# Patient Record
Sex: Female | Born: 1935 | Race: White | Hispanic: No | Smoking: Former smoker
Health system: Southern US, Community
[De-identification: ages and names within clinical notes are randomized; demographics above are authoritative.]

## PROBLEM LIST (undated history)

## (undated) DIAGNOSIS — R42 Dizziness and giddiness: Secondary | ICD-10-CM

## (undated) DIAGNOSIS — E7800 Pure hypercholesterolemia, unspecified: Secondary | ICD-10-CM

## (undated) DIAGNOSIS — E079 Disorder of thyroid, unspecified: Secondary | ICD-10-CM

## (undated) DIAGNOSIS — R3915 Urgency of urination: Secondary | ICD-10-CM

## (undated) HISTORY — PX: APPENDECTOMY: SHX54

## (undated) HISTORY — PX: HIP SURGERY: SHX245

## (undated) HISTORY — PX: TOE SURGERY: SHX1073

## (undated) HISTORY — DX: Disorder of thyroid, unspecified: E07.9

---

## 1999-02-08 ENCOUNTER — Encounter: Admission: RE | Admit: 1999-02-08 | Discharge: 1999-02-08 | Payer: Self-pay | Admitting: Internal Medicine

## 1999-02-08 ENCOUNTER — Encounter: Payer: Self-pay | Admitting: Internal Medicine

## 1999-09-15 ENCOUNTER — Other Ambulatory Visit: Admission: RE | Admit: 1999-09-15 | Discharge: 1999-09-15 | Payer: Self-pay | Admitting: Internal Medicine

## 2000-02-15 ENCOUNTER — Encounter: Payer: Self-pay | Admitting: Internal Medicine

## 2000-02-15 ENCOUNTER — Encounter: Admission: RE | Admit: 2000-02-15 | Discharge: 2000-02-15 | Payer: Self-pay | Admitting: Internal Medicine

## 2001-02-19 ENCOUNTER — Encounter: Payer: Self-pay | Admitting: Internal Medicine

## 2001-02-19 ENCOUNTER — Encounter: Admission: RE | Admit: 2001-02-19 | Discharge: 2001-02-19 | Payer: Self-pay | Admitting: Internal Medicine

## 2001-09-17 ENCOUNTER — Encounter: Payer: Self-pay | Admitting: Orthopedic Surgery

## 2001-09-24 ENCOUNTER — Encounter: Payer: Self-pay | Admitting: Orthopedic Surgery

## 2001-09-24 ENCOUNTER — Inpatient Hospital Stay (HOSPITAL_COMMUNITY): Admission: RE | Admit: 2001-09-24 | Discharge: 2001-09-28 | Payer: Self-pay | Admitting: Orthopedic Surgery

## 2002-02-06 ENCOUNTER — Encounter: Payer: Self-pay | Admitting: Internal Medicine

## 2002-02-06 ENCOUNTER — Encounter: Admission: RE | Admit: 2002-02-06 | Discharge: 2002-02-06 | Payer: Self-pay | Admitting: Internal Medicine

## 2003-02-10 ENCOUNTER — Encounter: Admission: RE | Admit: 2003-02-10 | Discharge: 2003-02-10 | Payer: Self-pay | Admitting: Internal Medicine

## 2004-03-04 ENCOUNTER — Encounter: Admission: RE | Admit: 2004-03-04 | Discharge: 2004-03-04 | Payer: Self-pay | Admitting: Internal Medicine

## 2005-03-09 ENCOUNTER — Encounter: Admission: RE | Admit: 2005-03-09 | Discharge: 2005-03-09 | Payer: Self-pay | Admitting: Internal Medicine

## 2005-10-17 ENCOUNTER — Ambulatory Visit: Payer: Self-pay | Admitting: Ophthalmology

## 2006-03-17 ENCOUNTER — Encounter: Admission: RE | Admit: 2006-03-17 | Discharge: 2006-03-17 | Payer: Self-pay | Admitting: Internal Medicine

## 2006-07-05 ENCOUNTER — Emergency Department (HOSPITAL_COMMUNITY): Admission: EM | Admit: 2006-07-05 | Discharge: 2006-07-05 | Payer: Self-pay | Admitting: Emergency Medicine

## 2007-03-19 ENCOUNTER — Encounter: Admission: RE | Admit: 2007-03-19 | Discharge: 2007-04-09 | Payer: Self-pay | Admitting: Orthopedic Surgery

## 2007-03-26 ENCOUNTER — Encounter: Admission: RE | Admit: 2007-03-26 | Discharge: 2007-03-26 | Payer: Self-pay | Admitting: Internal Medicine

## 2007-03-29 ENCOUNTER — Encounter: Admission: RE | Admit: 2007-03-29 | Discharge: 2007-03-29 | Payer: Self-pay | Admitting: Internal Medicine

## 2008-12-21 ENCOUNTER — Emergency Department (HOSPITAL_BASED_OUTPATIENT_CLINIC_OR_DEPARTMENT_OTHER): Admission: EM | Admit: 2008-12-21 | Discharge: 2008-12-21 | Payer: Self-pay | Admitting: Emergency Medicine

## 2009-02-10 ENCOUNTER — Emergency Department (HOSPITAL_BASED_OUTPATIENT_CLINIC_OR_DEPARTMENT_OTHER): Admission: EM | Admit: 2009-02-10 | Discharge: 2009-02-10 | Payer: Self-pay | Admitting: Emergency Medicine

## 2010-03-21 ENCOUNTER — Encounter: Payer: Self-pay | Admitting: Internal Medicine

## 2010-05-21 ENCOUNTER — Ambulatory Visit: Payer: Self-pay | Admitting: Ophthalmology

## 2010-05-31 ENCOUNTER — Ambulatory Visit: Payer: Self-pay | Admitting: Ophthalmology

## 2010-06-01 LAB — CBC
Hemoglobin: 14.4 g/dL (ref 12.0–15.0)
MCHC: 35.1 g/dL (ref 30.0–36.0)
RDW: 12.3 % (ref 11.5–15.5)

## 2010-06-01 LAB — DIFFERENTIAL
Basophils Relative: 0 % (ref 0–1)
Eosinophils Absolute: 0.1 10*3/uL (ref 0.0–0.7)
Monocytes Relative: 4 % (ref 3–12)
Neutro Abs: 4.1 10*3/uL (ref 1.7–7.7)

## 2010-06-01 LAB — BASIC METABOLIC PANEL
CO2: 24 mEq/L (ref 19–32)
Calcium: 9.5 mg/dL (ref 8.4–10.5)
Creatinine, Ser: 0.6 mg/dL (ref 0.4–1.2)
Glucose, Bld: 99 mg/dL (ref 70–99)
Sodium: 140 mEq/L (ref 135–145)

## 2010-07-16 NOTE — H&P (Signed)
[facility]  Patient:    NOVICE, VRBA Visit Number: [id number] MRN: [id number]          Service Type: Attending:  Ollen Gross, M.D. Dictated by:   Alexzandrew L. Perkins, P.A.-C. Adm. Date:  09/24/01   CC:         Lenon Curt. Cassell Clement, M.D.   History and Physical  CHIEF COMPLAINT:  Left hip pain.  HISTORY OF PRESENT ILLNESS:  The patient is an 77 year old female who has been seen and evaluated by Dr. Ollen Gross for left hip pain.  This has been ongoing for quite some time now.  She comes in to be evaluated for consideration of left hip replacement.  She continues with significant left groin pain which has started to interfere with her daily activity and mobility and states that she even hurts after walking or even swinging a golf club. She is not having much pain at rest, but it is interfering with her activity. She would like to proceed with total hip replacement.  The risks and benefits have been discussed, and she has elected to proceed with surgery.  ALLERGIES:  No known drug allergies.  CURRENT MEDICATIONS: 1. Celebrex daily, stopped three days prior to surgery. 2. Premarin 0.625 mg daily. 3. Lipitor 10 mg daily. 4. Aspirin 81 mg daily, stopped prior to surgery. 5. Other herbal medications and supplements, which she has stopped prior to    her surgery including ginkgo, Ocuvite, lysine. 6. Calcium D. 7. Glucosamine.  PAST MEDICAL HISTORY: 1. Hypercholesterolemia. 2. Previous history of hepatitis.  PAST SURGICAL HISTORY: 1. Tonsillectomy in childhood years. 2. Appendectomy in the 1940s. 3. Hysterectomy in the 1960s. 4. Removal of benign breast tumor in the 1990s.  SOCIAL HISTORY:  Married.  She is a Futures trader.  No alcohol.  Only one glass of wine on a daily basis.  Has five children.  Her husband will be assisting her with her care postoperatively.  She has a one-story home with two steps entering on the porch, and three steps  entering into the house total.  REVIEW OF SYSTEMS:  GENERAL:  No fevers, chills, or night sweats.  NEUROLOGIC: No seizures, syncope, or paralysis.  RESPIRATORY:  No shortness of breath, productive cough, or hemoptysis.  CARDIOVASCULAR:  No chest pain, angina, or orthopnea.  GASTROINTESTINAL:  No nausea, vomiting, diarrhea, or constipation. History of hepatitis dating back to the 27s.  GENITOURINARY:  No dysuria, hematuria, or discharge.  She has had some intermittent itching. MUSCULOSKELETAL:  Pertinent to the left hip found in history of present illness.  PHYSICAL EXAMINATION:  VITAL SIGNS:  Pulse 64, respirations 12, blood pressure 130/70.  GENERAL:  The patient is an 77 year old female, well-nourished, well-developed.  Appears to be in no acute distress.  She is alert, oriented, and cooperative.  Very pleasant at the time of exam.  HEENT:  Normocephalic, atraumatic.  Pupils are round and reactive.  EOMs are intact.  Oropharynx clear.  NECK:  Supple.  CHEST:  Clear to auscultation in anterior and posterior chest walls.  No rhonchi, rales, or wheezing appreciated.  HEART:  Regular rate and rhythm.  No murmur.  S1, S2 noted.  ABDOMEN:  Soft, nontender.  Bowel sounds are present.  EXTREMITIES:  Limited to that of the left lower extremity:  She has hip flexion of 90 degrees, internal rotation of only 5 degrees, external rotation of only 10 degrees, abduction of 20 degrees.  Motor function is intact.  LABORATORY DATA:  X-rays show  arthritic left hip.  IMPRESSION: 1. Osteoarthritis left hip. 2. History of hepatitis. 3. Hypercholesterolemia.  PLAN:  The patient will be admitted to [facility] to undergo left total hip replacement arthroplasty.  The surgery will be performed by Dr. Ollen Gross.  The patient has been seen preoperatively by Dr. Murray Hodgkins and has been cleared for her up and coming surgery.  Dr. Chilton Si will be notified of the room number on  admission at [facility] and will be consulted as needed for any medical assistance with this patient throughout the hospital course. Dictated by:   Alexzandrew L. Perkins, P.A.-C. Attending:  Ollen Gross, M.D. DD:  09/20/01 TD:  09/24/01 Job: [id number]

## 2010-07-16 NOTE — Discharge Summary (Signed)
NAME:  Carmen Roy, Carmen Roy                          ACCOUNT NO.:  [id number]   MEDICAL RECORD NO.:  [id number]                   PATIENT TYPE:  INP   LOCATION:  0472                                 FACILITY:  [facility]   PHYSICIAN:  Carmen Rankin. Roy, M.D.              DATE OF BIRTH:  02-09-1919   DATE OF ADMISSION:  09/24/2001  DATE OF DISCHARGE:  09/28/2001                                 DISCHARGE SUMMARY   ADMITTING DIAGNOSES:  1. Osteoarthritis left hip.  2. History of hepatitis.  3. Hypercholesterolemia.   DISCHARGE DIAGNOSES:  1. Osteoarthritis left hip status post left total hip replacement     arthroplasty.  2. History of hepatitis.  3. Hypercholesterolemia.  4. Postoperative hyponatremia, improved.  5. Postoperative hypokalemia, improved.   PROCEDURE:  The patient was taken to the OR on September 24, 2001.  Underwent a  left total hip replacement arthroplasty.  Surgeon Dr. Homero Fellers Roy.  Assistant Avel Peace, P.A.-C.  Surgery under spinal anesthesia.  Hemovac  drain x1.   CONSULTS:  None.   BRIEF HISTORY:  The patient is an 77 year old female.  Has been seen and  evaluated by Dr. Lequita Halt for left hip pain that has been ongoing for quite  some time now.  She continues with significant left groin pain that is  starting to interfere with her daily activity and mobility.  Even hurts  after walking or swinging golf club.  She would like to proceed with total  hip replacement.  Risks and benefits discussed.  She elected to proceed with  surgery.   LABORATORY DATA:  CBC on admission showed hemoglobin 12.7, hematocrit 36.7,  white cell count 6.7, red cell count 3.98.  Postoperative H&H 9.7 and 28.4.  Last noted H&H 10.0 and 28.9.  Differential on the admission CBC all within  normal limits.  PT/PTT no admission 13.8 and 24 respectively with an INR of  1.0.  Serum pro times/INRs were followed.  Last noted PT and INR 14.7 and  1.1.  Chemistry panel on admission all within normal  limits.  Serial BMETs  were followed.  Had a drop in sodium postoperative from 135 to 130.  Fluids  were adjusted and her sodium came back and was back up to normal at 137.  Potassium dropped from 4.3 to 3.3.  Was placed on potassium supplements and  was back up to 4.3.  Glucose did elevate from 105 to 124 back down to 92.  Calcium dropped from 8.7 to 7.7.  Was back up to 8.4.  Urinalysis on  admission did show large leukocyte esterase with few epithelial cells, 11-20  white cells, and few bacteria.  Follow-up UA on September 24, 2001:  Moderate  hemoglobin, large leukocyte esterase, few epithelial cells, 21-50 white  cells, 3-6 red cells, few bacteria.  Blood group type A-.  EKG dated September 17, 2001:  Normal sinus rhythm, nonspecific  T-wave abnormalities.  No old  tracings to compare.  Confirmed by Dr. Olga Millers.  Chest x-ray dated  September 17, 2001:  Hyperinflation of lungs, question mild COPD.  Hip films  dated September 17, 2001:  Findings compatible with osteoarthritis left hip.  Postoperative hip films, pelvis, and left hip September 24, 2001 show  satisfactory postoperative repair of left total hip prosthesis.   HOSPITAL COURSE:  The patient was admitted to [facility], taken to  the operating room, and underwent the above said procedure without  complications.  The patient tolerated procedure well.  Was later transferred  to recovery room and then to the orthopedic floor for continued  postoperative care.  The patient received PCA analgesics for pain control  following surgery.  She was placed on Erixtra for DVT prophylaxis.  She did  receive 24 hours of IV antibiotics.  It was noted that she had a urinary  tract infection.  This was treated with IV antibiotics.  She was placed on  total hip protocol.  PT and OT were consulted postoperative to assist with  gait training, ambulation, and hip precautions.  The patient progressed very  well with her physical therapy and was up ambulating  approximately 70 feet  by postoperative day two and increased up to 90 feet.  Hemovac drain which  was placed at time of surgery was pulled on postoperative day one.  Dressing  change was initiated on postoperative day two.  Incision was healing well.  PCA and IV and Foleys were discontinued on postoperative day two.  She did  have some complaints of nausea by postoperative day three; however, this was  treated with antiemetics p.r.n.  By postoperative day four she was doing  much better.  Had been up ambulating well with physical therapy.  Tolerating  her medications.  It was decided she could be discharged home.   DISCHARGE PLAN:  The patient is discharged home on September 28, 2001.   DISCHARGE DIAGNOSES:  Please see above.   DISCHARGE MEDICATIONS:  1. Erixtra for six more days.  Once the Clista Bernhardt is completed she will take     enteric-coated aspirin daily.  2. Vicodin for pain.  3. Robaxin for spasm.   DIET:  Low cholesterol diet.   ACTIVITY:  Touchdown weightbearing, hip precautions, home health PT, home  health nursing through [facility].   FOLLOW UP:  Thursday, August 14.  Call the office for an appointment at [phone]  [phone].   CONDITION ON DISCHARGE:  Improved.     Alexzandrew L. Julien Girt, P.A.              Carmen Roy, M.D.    ALP/MEDQ  D:  10/10/2001  T:  10/12/2001  Job:  [id number]

## 2010-07-16 NOTE — Op Note (Signed)
Carmen Roy, Carmen Roy                         ACCOUNT NO.:  [id number]   MEDICAL RECORD NO.:  [id number]                   PATIENT TYPE:  INP   LOCATION:  0472                                 [facility]ACILITY:  [facility]   PHYSICIAN:  Gus Rankin. Aluisio, M.D.              DATE OF BIRTH:  Jan 03, 1919   DATE OF PROCEDURE:  09/24/2001  DATE OF DISCHARGE:                                 OPERATIVE REPORT   PREOPERATIVE DIAGNOSIS:  Osteoarthritis, left hip.   POSTOPERATIVE DIAGNOSIS:  Osteoarthritis, left hip.   PROCEDURE:  Left total hip arthroplasty.   SURGEON:  Gus Rankin. Aluisio, M.D.   ASSISTANT:  Alexzandrew L. Perkins, P.A.-C.   ANESTHESIA:  Spinal.   ESTIMATED BLOOD LOSS:  Minimal.   DRAINS:  Hemovac x 1.   COMPLICATIONS:  None.   DISPOSITION:  Stable to recovery.   BRIEF CLINICAL NOTE:  The patient is an 77 year old female with severe  osteoarthritis of the left hip with pain refractory to nonoperative  management.  She has progressively worsening symptoms and presents now for  total hip arthroplasty.   PROCEDURE IN DETAIL:  After the successful administration of spinal  anesthetic, the patient was placed in the right lateral decubitus position  with the left side up and held with the hip positioner.  The left lower  extremity was isolated from her perineum with plastic drapes and prepped and  draped in the usual sterile fashion.  A short posterolateral incision was  made with the 10 blade through subcutaneous tissue to the level of the  fascia lata which was incised in line with the skin incision.  Sciatic nerve  is palpated and protected and short external rotators isolated off the  femur.  The capsulotomy is performed.  I strip the capsule to stay posterior  and medial, and the capsule is removed.  Hip is then dislocated and the  center of the femoral head marked.  Trial prosthesis is placed such that the  center of the trial head corresponds to the center of the native  femoral  head.  The osteotomy line is then marked and an osteotomy made with an  oscillating saw.  Head is removed and the femur retracted anteriorly.  Acetabular exposure is obtained.   Acetabular reaming starts at 40, coursing increments of 49, and then a 15 mm  Pinnacle acetabular shell is placed.  It is an excellent fit and then is  transfixed with two dome screws.  Trial 28 mm neutral liner is placed.   Femur is then prepared, first with a canal finder and irrigation.  Axial  remaining is performed up to an 11.5 mm, proximal reaming up to a 16D, and  the sleeve is machined to a small.  The 16D small trial sleeve is placed,  and then the 16 x 11 stem with a 36 plus 6 neck is placed, matching her  native anteversion.  The  28 plus 3 head is placed as the resection level is  planed down to a smoother surface.  The hip is reduced, and there is  excellent soft tissue tension.  She had excellent stability, full extension,  full external rotation, 70 degrees flexion, 40 degrees adduction, 90 degrees  internal rotation; then 90 degrees flexion, 90 degrees internal rotation.  The wound is then copiously irrigated and all trials removed.  The apex hole  eliminator is placed into the acetabular shell, then the permanent 28 mm  neutral Marathon liner placed.  The 18D small sleeve is then placed into the  proximal femur, 18 x 13 stem, with a 36 plus 6 neck is placed.  A 28 plus 3  head is placed, hip reduced with the same stability parameters.  The wound  is copiously irrigated with antibiotic solution and short external rotators  reattached to the femur for drill holes.  The fascia lata is closed over a  Hemovac drain with interrupted #1 Vicryl, subcu closed with interrupted 2-0  Vicryl, and subcuticular running 4-0 Monocryl.  Incision is clean and dry  and Steri-Strips and a bulky sterile dressing applied.  Drain is hooked to  suction.  She is placed into a knee immobilizer, awakened, and  transported  to recovery in stable condition.                                                Gus Rankin Aluisio, M.D.    FVA/MEDQ  D:  09/24/2001  T:  09/27/2001  Job:  [id number]

## 2011-03-16 DIAGNOSIS — M5137 Other intervertebral disc degeneration, lumbosacral region: Secondary | ICD-10-CM | POA: Diagnosis not present

## 2011-03-16 DIAGNOSIS — M999 Biomechanical lesion, unspecified: Secondary | ICD-10-CM | POA: Diagnosis not present

## 2011-03-16 DIAGNOSIS — M545 Low back pain: Secondary | ICD-10-CM | POA: Diagnosis not present

## 2011-04-14 DIAGNOSIS — M999 Biomechanical lesion, unspecified: Secondary | ICD-10-CM | POA: Diagnosis not present

## 2011-04-14 DIAGNOSIS — M5137 Other intervertebral disc degeneration, lumbosacral region: Secondary | ICD-10-CM | POA: Diagnosis not present

## 2011-04-14 DIAGNOSIS — M545 Low back pain: Secondary | ICD-10-CM | POA: Diagnosis not present

## 2011-05-05 DIAGNOSIS — Z1231 Encounter for screening mammogram for malignant neoplasm of breast: Secondary | ICD-10-CM | POA: Diagnosis not present

## 2011-05-18 DIAGNOSIS — M545 Low back pain: Secondary | ICD-10-CM | POA: Diagnosis not present

## 2011-05-18 DIAGNOSIS — M5137 Other intervertebral disc degeneration, lumbosacral region: Secondary | ICD-10-CM | POA: Diagnosis not present

## 2011-05-18 DIAGNOSIS — M999 Biomechanical lesion, unspecified: Secondary | ICD-10-CM | POA: Diagnosis not present

## 2011-06-17 DIAGNOSIS — M999 Biomechanical lesion, unspecified: Secondary | ICD-10-CM | POA: Diagnosis not present

## 2011-06-17 DIAGNOSIS — M5137 Other intervertebral disc degeneration, lumbosacral region: Secondary | ICD-10-CM | POA: Diagnosis not present

## 2011-06-17 DIAGNOSIS — M545 Low back pain: Secondary | ICD-10-CM | POA: Diagnosis not present

## 2011-07-13 DIAGNOSIS — M999 Biomechanical lesion, unspecified: Secondary | ICD-10-CM | POA: Diagnosis not present

## 2011-07-13 DIAGNOSIS — M5137 Other intervertebral disc degeneration, lumbosacral region: Secondary | ICD-10-CM | POA: Diagnosis not present

## 2011-07-13 DIAGNOSIS — M545 Low back pain: Secondary | ICD-10-CM | POA: Diagnosis not present

## 2011-08-11 DIAGNOSIS — M999 Biomechanical lesion, unspecified: Secondary | ICD-10-CM | POA: Diagnosis not present

## 2011-08-11 DIAGNOSIS — M545 Low back pain: Secondary | ICD-10-CM | POA: Diagnosis not present

## 2011-08-11 DIAGNOSIS — M5137 Other intervertebral disc degeneration, lumbosacral region: Secondary | ICD-10-CM | POA: Diagnosis not present

## 2011-09-08 DIAGNOSIS — M545 Low back pain: Secondary | ICD-10-CM | POA: Diagnosis not present

## 2011-09-08 DIAGNOSIS — M5137 Other intervertebral disc degeneration, lumbosacral region: Secondary | ICD-10-CM | POA: Diagnosis not present

## 2011-09-08 DIAGNOSIS — M999 Biomechanical lesion, unspecified: Secondary | ICD-10-CM | POA: Diagnosis not present

## 2011-10-06 DIAGNOSIS — M5137 Other intervertebral disc degeneration, lumbosacral region: Secondary | ICD-10-CM | POA: Diagnosis not present

## 2011-10-06 DIAGNOSIS — M545 Low back pain: Secondary | ICD-10-CM | POA: Diagnosis not present

## 2011-10-06 DIAGNOSIS — M999 Biomechanical lesion, unspecified: Secondary | ICD-10-CM | POA: Diagnosis not present

## 2011-10-20 DIAGNOSIS — R82998 Other abnormal findings in urine: Secondary | ICD-10-CM | POA: Diagnosis not present

## 2011-10-20 DIAGNOSIS — M899 Disorder of bone, unspecified: Secondary | ICD-10-CM | POA: Diagnosis not present

## 2011-10-20 DIAGNOSIS — E785 Hyperlipidemia, unspecified: Secondary | ICD-10-CM | POA: Diagnosis not present

## 2011-10-20 DIAGNOSIS — M949 Disorder of cartilage, unspecified: Secondary | ICD-10-CM | POA: Diagnosis not present

## 2011-10-20 DIAGNOSIS — E039 Hypothyroidism, unspecified: Secondary | ICD-10-CM | POA: Diagnosis not present

## 2011-10-27 DIAGNOSIS — E039 Hypothyroidism, unspecified: Secondary | ICD-10-CM | POA: Diagnosis not present

## 2011-10-27 DIAGNOSIS — H5310 Unspecified subjective visual disturbances: Secondary | ICD-10-CM | POA: Diagnosis not present

## 2011-10-27 DIAGNOSIS — Z23 Encounter for immunization: Secondary | ICD-10-CM | POA: Diagnosis not present

## 2011-10-27 DIAGNOSIS — E785 Hyperlipidemia, unspecified: Secondary | ICD-10-CM | POA: Diagnosis not present

## 2011-10-27 DIAGNOSIS — M949 Disorder of cartilage, unspecified: Secondary | ICD-10-CM | POA: Diagnosis not present

## 2011-10-27 DIAGNOSIS — Z0000 Encounter for general adult medical examination without abnormal findings: Secondary | ICD-10-CM | POA: Diagnosis not present

## 2011-10-27 DIAGNOSIS — M899 Disorder of bone, unspecified: Secondary | ICD-10-CM | POA: Diagnosis not present

## 2011-10-27 DIAGNOSIS — H52209 Unspecified astigmatism, unspecified eye: Secondary | ICD-10-CM | POA: Diagnosis not present

## 2011-11-03 DIAGNOSIS — M545 Low back pain: Secondary | ICD-10-CM | POA: Diagnosis not present

## 2011-11-03 DIAGNOSIS — M5137 Other intervertebral disc degeneration, lumbosacral region: Secondary | ICD-10-CM | POA: Diagnosis not present

## 2011-11-03 DIAGNOSIS — M999 Biomechanical lesion, unspecified: Secondary | ICD-10-CM | POA: Diagnosis not present

## 2011-12-01 DIAGNOSIS — M999 Biomechanical lesion, unspecified: Secondary | ICD-10-CM | POA: Diagnosis not present

## 2011-12-01 DIAGNOSIS — M545 Low back pain: Secondary | ICD-10-CM | POA: Diagnosis not present

## 2011-12-01 DIAGNOSIS — M5137 Other intervertebral disc degeneration, lumbosacral region: Secondary | ICD-10-CM | POA: Diagnosis not present

## 2011-12-05 DIAGNOSIS — L570 Actinic keratosis: Secondary | ICD-10-CM | POA: Diagnosis not present

## 2011-12-05 DIAGNOSIS — C44611 Basal cell carcinoma of skin of unspecified upper limb, including shoulder: Secondary | ICD-10-CM | POA: Diagnosis not present

## 2011-12-05 DIAGNOSIS — D0460 Carcinoma in situ of skin of unspecified upper limb, including shoulder: Secondary | ICD-10-CM | POA: Diagnosis not present

## 2011-12-05 DIAGNOSIS — L821 Other seborrheic keratosis: Secondary | ICD-10-CM | POA: Diagnosis not present

## 2012-01-10 DIAGNOSIS — M999 Biomechanical lesion, unspecified: Secondary | ICD-10-CM | POA: Diagnosis not present

## 2012-01-10 DIAGNOSIS — M545 Low back pain: Secondary | ICD-10-CM | POA: Diagnosis not present

## 2012-01-10 DIAGNOSIS — M5137 Other intervertebral disc degeneration, lumbosacral region: Secondary | ICD-10-CM | POA: Diagnosis not present

## 2012-02-07 DIAGNOSIS — M5137 Other intervertebral disc degeneration, lumbosacral region: Secondary | ICD-10-CM | POA: Diagnosis not present

## 2012-02-07 DIAGNOSIS — M545 Low back pain: Secondary | ICD-10-CM | POA: Diagnosis not present

## 2012-02-07 DIAGNOSIS — M999 Biomechanical lesion, unspecified: Secondary | ICD-10-CM | POA: Diagnosis not present

## 2012-02-10 DIAGNOSIS — Z961 Presence of intraocular lens: Secondary | ICD-10-CM | POA: Diagnosis not present

## 2012-03-06 DIAGNOSIS — M5137 Other intervertebral disc degeneration, lumbosacral region: Secondary | ICD-10-CM | POA: Diagnosis not present

## 2012-03-06 DIAGNOSIS — M545 Low back pain: Secondary | ICD-10-CM | POA: Diagnosis not present

## 2012-03-06 DIAGNOSIS — M999 Biomechanical lesion, unspecified: Secondary | ICD-10-CM | POA: Diagnosis not present

## 2012-11-01 DIAGNOSIS — R82998 Other abnormal findings in urine: Secondary | ICD-10-CM | POA: Diagnosis not present

## 2012-11-01 DIAGNOSIS — M899 Disorder of bone, unspecified: Secondary | ICD-10-CM | POA: Diagnosis not present

## 2012-11-01 DIAGNOSIS — E785 Hyperlipidemia, unspecified: Secondary | ICD-10-CM | POA: Diagnosis not present

## 2012-11-01 DIAGNOSIS — E039 Hypothyroidism, unspecified: Secondary | ICD-10-CM | POA: Diagnosis not present

## 2012-11-08 DIAGNOSIS — E785 Hyperlipidemia, unspecified: Secondary | ICD-10-CM | POA: Diagnosis not present

## 2012-11-08 DIAGNOSIS — E039 Hypothyroidism, unspecified: Secondary | ICD-10-CM | POA: Diagnosis not present

## 2012-11-08 DIAGNOSIS — Z23 Encounter for immunization: Secondary | ICD-10-CM | POA: Diagnosis not present

## 2012-11-08 DIAGNOSIS — Z1331 Encounter for screening for depression: Secondary | ICD-10-CM | POA: Diagnosis not present

## 2012-11-08 DIAGNOSIS — Z0000 Encounter for general adult medical examination without abnormal findings: Secondary | ICD-10-CM | POA: Diagnosis not present

## 2012-11-08 DIAGNOSIS — H6980 Other specified disorders of Eustachian tube, unspecified ear: Secondary | ICD-10-CM | POA: Diagnosis not present

## 2012-11-08 DIAGNOSIS — G609 Hereditary and idiopathic neuropathy, unspecified: Secondary | ICD-10-CM | POA: Diagnosis not present

## 2012-11-08 DIAGNOSIS — M899 Disorder of bone, unspecified: Secondary | ICD-10-CM | POA: Diagnosis not present

## 2012-11-08 DIAGNOSIS — H8110 Benign paroxysmal vertigo, unspecified ear: Secondary | ICD-10-CM | POA: Diagnosis not present

## 2012-11-08 DIAGNOSIS — M1990 Unspecified osteoarthritis, unspecified site: Secondary | ICD-10-CM | POA: Diagnosis not present

## 2012-11-09 DIAGNOSIS — Z1212 Encounter for screening for malignant neoplasm of rectum: Secondary | ICD-10-CM | POA: Diagnosis not present

## 2012-12-06 DIAGNOSIS — D239 Other benign neoplasm of skin, unspecified: Secondary | ICD-10-CM | POA: Diagnosis not present

## 2012-12-06 DIAGNOSIS — Z85828 Personal history of other malignant neoplasm of skin: Secondary | ICD-10-CM | POA: Diagnosis not present

## 2012-12-06 DIAGNOSIS — C44721 Squamous cell carcinoma of skin of unspecified lower limb, including hip: Secondary | ICD-10-CM | POA: Diagnosis not present

## 2012-12-06 DIAGNOSIS — L821 Other seborrheic keratosis: Secondary | ICD-10-CM | POA: Diagnosis not present

## 2012-12-06 DIAGNOSIS — D485 Neoplasm of uncertain behavior of skin: Secondary | ICD-10-CM | POA: Diagnosis not present

## 2012-12-06 DIAGNOSIS — L570 Actinic keratosis: Secondary | ICD-10-CM | POA: Diagnosis not present

## 2013-02-14 DIAGNOSIS — H52209 Unspecified astigmatism, unspecified eye: Secondary | ICD-10-CM | POA: Diagnosis not present

## 2013-02-14 DIAGNOSIS — Z961 Presence of intraocular lens: Secondary | ICD-10-CM | POA: Diagnosis not present

## 2013-03-06 DIAGNOSIS — F321 Major depressive disorder, single episode, moderate: Secondary | ICD-10-CM | POA: Diagnosis not present

## 2013-03-06 DIAGNOSIS — IMO0002 Reserved for concepts with insufficient information to code with codable children: Secondary | ICD-10-CM | POA: Diagnosis not present

## 2013-03-06 DIAGNOSIS — E785 Hyperlipidemia, unspecified: Secondary | ICD-10-CM | POA: Diagnosis not present

## 2013-03-06 DIAGNOSIS — E039 Hypothyroidism, unspecified: Secondary | ICD-10-CM | POA: Diagnosis not present

## 2013-03-06 DIAGNOSIS — H8110 Benign paroxysmal vertigo, unspecified ear: Secondary | ICD-10-CM | POA: Diagnosis not present

## 2013-06-11 DIAGNOSIS — L723 Sebaceous cyst: Secondary | ICD-10-CM | POA: Diagnosis not present

## 2013-06-11 DIAGNOSIS — Z85828 Personal history of other malignant neoplasm of skin: Secondary | ICD-10-CM | POA: Diagnosis not present

## 2013-06-11 DIAGNOSIS — L820 Inflamed seborrheic keratosis: Secondary | ICD-10-CM | POA: Diagnosis not present

## 2013-07-08 ENCOUNTER — Encounter (HOSPITAL_BASED_OUTPATIENT_CLINIC_OR_DEPARTMENT_OTHER): Payer: Self-pay | Admitting: Emergency Medicine

## 2013-07-08 ENCOUNTER — Emergency Department (HOSPITAL_BASED_OUTPATIENT_CLINIC_OR_DEPARTMENT_OTHER)
Admission: EM | Admit: 2013-07-08 | Discharge: 2013-07-08 | Disposition: A | Discharge status: Home / Self Care | Payer: Medicare Other | Attending: Emergency Medicine | Admitting: Emergency Medicine

## 2013-07-08 ENCOUNTER — Emergency Department (HOSPITAL_BASED_OUTPATIENT_CLINIC_OR_DEPARTMENT_OTHER): Payer: Medicare Other

## 2013-07-08 DIAGNOSIS — J3489 Other specified disorders of nose and nasal sinuses: Secondary | ICD-10-CM | POA: Diagnosis not present

## 2013-07-08 DIAGNOSIS — E7800 Pure hypercholesterolemia, unspecified: Secondary | ICD-10-CM | POA: Diagnosis not present

## 2013-07-08 DIAGNOSIS — R51 Headache: Secondary | ICD-10-CM | POA: Insufficient documentation

## 2013-07-08 DIAGNOSIS — R059 Cough, unspecified: Secondary | ICD-10-CM | POA: Diagnosis not present

## 2013-07-08 DIAGNOSIS — Z87891 Personal history of nicotine dependence: Secondary | ICD-10-CM | POA: Insufficient documentation

## 2013-07-08 DIAGNOSIS — R0602 Shortness of breath: Secondary | ICD-10-CM | POA: Insufficient documentation

## 2013-07-08 DIAGNOSIS — J449 Chronic obstructive pulmonary disease, unspecified: Secondary | ICD-10-CM | POA: Diagnosis not present

## 2013-07-08 DIAGNOSIS — R05 Cough: Secondary | ICD-10-CM | POA: Diagnosis not present

## 2013-07-08 DIAGNOSIS — R509 Fever, unspecified: Secondary | ICD-10-CM | POA: Insufficient documentation

## 2013-07-08 HISTORY — DX: Pure hypercholesterolemia, unspecified: E78.00

## 2013-07-08 MED ORDER — ACETAMINOPHEN 325 MG PO TABS: 650.0000 mg | ORAL_TABLET | Freq: Once | ORAL | Status: DC

## 2013-07-08 NOTE — ED Notes (Signed)
States the MD told her she could have a Tylenol. States she does not want a Tylenol she wants to leave. Putting her clothes on getting ready to leave.

## 2013-07-08 NOTE — ED Notes (Signed)
Pt reports nonproductive cough and not feeling well for 4 days.  Low grade fever up to 100F.  Denies n/v/d.

## 2013-07-08 NOTE — ED Provider Notes (Signed)
CSN: [id number]     Arrival date & time 07/08/13  1752 History  This chart was scribed for Carmen Cookey, MD by Ladene Artist, ED Scribe. The patient was seen in room MH03/MH03. Patient's care was started at 9:39 PM.    Chief Complaint  Patient presents with  . URI    The history is provided by the patient. No language interpreter was used.   HPI Comments: Carmen Roy is a 80 y.o. female who presents to the Emergency Department complaining of a deep, dry cough onset 3-4 days ago. Pt states that she noticed a tickle in her throat that has resolved before she noticed the cough. She reports associated mild HA and fever today. Tmax 100 F, ED temperature 98.6 F. She reports rhinorrhea and SOB that she states that she has noted for years, no change from chronic. She denies sore throat now, chest pain, nasal congestion, ear pain, itchy eyes, nausea, abdominal pain, vomiting, diarrhea, any urinary symptoms. No h/o allergies.  Past Medical History  Diagnosis Date  . Hypercholesteremia    Past Surgical History  Procedure Laterality Date  . Hip surgery    . Toe surgery     No family history on file. History  Substance Use Topics  . Smoking status: Former Research scientist (life sciences)  . Smokeless tobacco: Not on file  . Alcohol Use: 0.6 oz/week    1 Glasses of wine per week   OB History   Grav Para Term Preterm Abortions TAB SAB Ect Mult Living                 Review of Systems  Constitutional: Positive for fever.  HENT: Positive for rhinorrhea. Negative for congestion and ear pain.   Eyes: Negative for itching.  Cardiovascular: Negative for chest pain.  Gastrointestinal: Negative for nausea, vomiting, abdominal pain and diarrhea.  Genitourinary: Negative for dysuria, hematuria and difficulty urinating.  Allergic/Immunologic: Negative for environmental allergies.  Neurological: Positive for headaches.  All other systems reviewed and are negative.   Allergies  Review of patient's allergies  indicates no known allergies.  Home Medications   Prior to Admission medications   Medication Sig Start Date End Date Taking? Authorizing Provider  atorvastatin (LIPITOR) 10 MG tablet Take 10 mg by mouth daily.   Yes Historical Provider, MD   Triage Vitals: BP 128/64  Pulse 86  Temp(Src) 98.3 F (36.8 C) (Oral)  Resp 18  Ht 5\' 3"  (1.6 m)  Wt 180 lb (81.647 kg)  BMI 31.89 kg/m2  SpO2 96% Physical Exam  Nursing note and vitals reviewed. Constitutional: She is oriented to person, place, and time. She appears well-developed and well-nourished. No distress.  HENT:  Head: Normocephalic and atraumatic.  Mouth/Throat: Oropharynx is clear and moist.  Eyes: Conjunctivae are normal. Pupils are equal, round, and reactive to light. No scleral icterus.  Neck: Neck supple.  Cardiovascular: Normal rate, regular rhythm, normal heart sounds and intact distal pulses.   No murmur heard. Pulmonary/Chest: Effort normal and breath sounds normal. No stridor. No respiratory distress. She has no rales.  Abdominal: Soft. Bowel sounds are normal. She exhibits no distension. There is no tenderness.  Musculoskeletal: Normal range of motion.  Neurological: She is alert and oriented to person, place, and time.  Skin: Skin is warm and dry. No rash noted.  Psychiatric: She has a normal mood and affect. Her behavior is normal.    ED Course  Procedures (including critical care time) DIAGNOSTIC STUDIES: Oxygen Saturation is  95% on RA, adequate by my interpretation.    COORDINATION OF CARE: 9:50 PM Discussed treatment plan with pt at bedside and pt agreed to plan.  Labs Review Labs Reviewed - No data to display  Imaging Review Dg Chest 2 View  07/08/2013   CLINICAL DATA:  Cough  EXAM: CHEST  2 VIEW  COMPARISON:  None.  FINDINGS: The cardiac shadow is within normal limits. The lungs are mildly hyperinflated. No focal confluent infiltrate is seen. Minimal scarring is noted in the right upper lobe. No acute  bony abnormality is seen.  IMPRESSION: No acute abnormality is noted.  COPD.   Electronically Signed   By: Inez Catalina M.D.   On: 07/08/2013 18:43  All radiology studies independently viewed by me.      EKG Interpretation None      MDM   Final diagnoses:  Cough    Pt reported a "tickle" in her throat, now mostly resolved, and a one time temperature of 100.0.  She has a mild dry cough with clear lungs and a normal CXR.  She does not have any evidence of bacterial infection and is very well appearing.  Advised monitoring of her symptoms and follow up with PCP.    I personally performed the services described in this documentation, which was scribed in my presence. The recorded information has been reviewed and is accurate.    Houston Siren III, MD 07/11/13 [phone]

## 2013-07-08 NOTE — Discharge Instructions (Signed)

## 2013-07-15 DIAGNOSIS — IMO0002 Reserved for concepts with insufficient information to code with codable children: Secondary | ICD-10-CM | POA: Diagnosis not present

## 2013-07-15 DIAGNOSIS — J449 Chronic obstructive pulmonary disease, unspecified: Secondary | ICD-10-CM | POA: Diagnosis not present

## 2013-07-15 DIAGNOSIS — J309 Allergic rhinitis, unspecified: Secondary | ICD-10-CM | POA: Diagnosis not present

## 2013-11-14 DIAGNOSIS — M899 Disorder of bone, unspecified: Secondary | ICD-10-CM | POA: Diagnosis not present

## 2013-11-14 DIAGNOSIS — E785 Hyperlipidemia, unspecified: Secondary | ICD-10-CM | POA: Diagnosis not present

## 2013-11-14 DIAGNOSIS — E039 Hypothyroidism, unspecified: Secondary | ICD-10-CM | POA: Diagnosis not present

## 2013-11-14 DIAGNOSIS — M949 Disorder of cartilage, unspecified: Secondary | ICD-10-CM | POA: Diagnosis not present

## 2013-11-21 DIAGNOSIS — Z23 Encounter for immunization: Secondary | ICD-10-CM | POA: Diagnosis not present

## 2013-11-21 DIAGNOSIS — M1990 Unspecified osteoarthritis, unspecified site: Secondary | ICD-10-CM | POA: Diagnosis not present

## 2013-11-21 DIAGNOSIS — Z0000 Encounter for general adult medical examination without abnormal findings: Secondary | ICD-10-CM | POA: Diagnosis not present

## 2013-11-21 DIAGNOSIS — M949 Disorder of cartilage, unspecified: Secondary | ICD-10-CM | POA: Diagnosis not present

## 2013-11-21 DIAGNOSIS — Z1331 Encounter for screening for depression: Secondary | ICD-10-CM | POA: Diagnosis not present

## 2013-11-21 DIAGNOSIS — J309 Allergic rhinitis, unspecified: Secondary | ICD-10-CM | POA: Diagnosis not present

## 2013-11-21 DIAGNOSIS — J449 Chronic obstructive pulmonary disease, unspecified: Secondary | ICD-10-CM | POA: Diagnosis not present

## 2013-11-21 DIAGNOSIS — F321 Major depressive disorder, single episode, moderate: Secondary | ICD-10-CM | POA: Diagnosis not present

## 2013-11-21 DIAGNOSIS — G609 Hereditary and idiopathic neuropathy, unspecified: Secondary | ICD-10-CM | POA: Diagnosis not present

## 2013-11-21 DIAGNOSIS — IMO0002 Reserved for concepts with insufficient information to code with codable children: Secondary | ICD-10-CM | POA: Diagnosis not present

## 2013-11-21 DIAGNOSIS — M899 Disorder of bone, unspecified: Secondary | ICD-10-CM | POA: Diagnosis not present

## 2013-12-02 DIAGNOSIS — Z1212 Encounter for screening for malignant neoplasm of rectum: Secondary | ICD-10-CM | POA: Diagnosis not present

## 2013-12-10 DIAGNOSIS — L570 Actinic keratosis: Secondary | ICD-10-CM | POA: Diagnosis not present

## 2013-12-10 DIAGNOSIS — D2239 Melanocytic nevi of other parts of face: Secondary | ICD-10-CM | POA: Diagnosis not present

## 2013-12-10 DIAGNOSIS — D485 Neoplasm of uncertain behavior of skin: Secondary | ICD-10-CM | POA: Diagnosis not present

## 2013-12-10 DIAGNOSIS — L821 Other seborrheic keratosis: Secondary | ICD-10-CM | POA: Diagnosis not present

## 2013-12-10 DIAGNOSIS — Z85828 Personal history of other malignant neoplasm of skin: Secondary | ICD-10-CM | POA: Diagnosis not present

## 2013-12-11 DIAGNOSIS — M8580 Other specified disorders of bone density and structure, unspecified site: Secondary | ICD-10-CM | POA: Diagnosis not present

## 2014-03-27 DIAGNOSIS — Z961 Presence of intraocular lens: Secondary | ICD-10-CM | POA: Diagnosis not present

## 2014-05-21 DIAGNOSIS — H8110 Benign paroxysmal vertigo, unspecified ear: Secondary | ICD-10-CM | POA: Diagnosis not present

## 2014-05-21 DIAGNOSIS — E039 Hypothyroidism, unspecified: Secondary | ICD-10-CM | POA: Diagnosis not present

## 2014-05-21 DIAGNOSIS — R202 Paresthesia of skin: Secondary | ICD-10-CM | POA: Diagnosis not present

## 2014-05-21 DIAGNOSIS — M1990 Unspecified osteoarthritis, unspecified site: Secondary | ICD-10-CM | POA: Diagnosis not present

## 2014-05-21 DIAGNOSIS — F321 Major depressive disorder, single episode, moderate: Secondary | ICD-10-CM | POA: Diagnosis not present

## 2014-05-21 DIAGNOSIS — G629 Polyneuropathy, unspecified: Secondary | ICD-10-CM | POA: Diagnosis not present

## 2014-05-21 DIAGNOSIS — J449 Chronic obstructive pulmonary disease, unspecified: Secondary | ICD-10-CM | POA: Diagnosis not present

## 2014-05-21 DIAGNOSIS — Z6824 Body mass index (BMI) 24.0-24.9, adult: Secondary | ICD-10-CM | POA: Diagnosis not present

## 2014-05-21 DIAGNOSIS — M859 Disorder of bone density and structure, unspecified: Secondary | ICD-10-CM | POA: Diagnosis not present

## 2014-05-21 DIAGNOSIS — E785 Hyperlipidemia, unspecified: Secondary | ICD-10-CM | POA: Diagnosis not present

## 2014-06-18 DIAGNOSIS — Z85828 Personal history of other malignant neoplasm of skin: Secondary | ICD-10-CM | POA: Diagnosis not present

## 2014-06-18 DIAGNOSIS — L570 Actinic keratosis: Secondary | ICD-10-CM | POA: Diagnosis not present

## 2014-06-18 DIAGNOSIS — L821 Other seborrheic keratosis: Secondary | ICD-10-CM | POA: Diagnosis not present

## 2014-11-20 DIAGNOSIS — N390 Urinary tract infection, site not specified: Secondary | ICD-10-CM | POA: Diagnosis not present

## 2014-11-20 DIAGNOSIS — E039 Hypothyroidism, unspecified: Secondary | ICD-10-CM | POA: Diagnosis not present

## 2014-11-20 DIAGNOSIS — R8290 Unspecified abnormal findings in urine: Secondary | ICD-10-CM | POA: Diagnosis not present

## 2014-11-20 DIAGNOSIS — E785 Hyperlipidemia, unspecified: Secondary | ICD-10-CM | POA: Diagnosis not present

## 2014-11-20 DIAGNOSIS — M859 Disorder of bone density and structure, unspecified: Secondary | ICD-10-CM | POA: Diagnosis not present

## 2014-11-27 DIAGNOSIS — J449 Chronic obstructive pulmonary disease, unspecified: Secondary | ICD-10-CM | POA: Diagnosis not present

## 2014-11-27 DIAGNOSIS — F325 Major depressive disorder, single episode, in full remission: Secondary | ICD-10-CM | POA: Diagnosis not present

## 2014-11-27 DIAGNOSIS — Z23 Encounter for immunization: Secondary | ICD-10-CM | POA: Diagnosis not present

## 2014-11-27 DIAGNOSIS — Z6824 Body mass index (BMI) 24.0-24.9, adult: Secondary | ICD-10-CM | POA: Diagnosis not present

## 2014-11-27 DIAGNOSIS — Z0000 Encounter for general adult medical examination without abnormal findings: Secondary | ICD-10-CM | POA: Diagnosis not present

## 2014-11-27 DIAGNOSIS — E785 Hyperlipidemia, unspecified: Secondary | ICD-10-CM | POA: Diagnosis not present

## 2014-11-27 DIAGNOSIS — J309 Allergic rhinitis, unspecified: Secondary | ICD-10-CM | POA: Diagnosis not present

## 2014-11-27 DIAGNOSIS — E039 Hypothyroidism, unspecified: Secondary | ICD-10-CM | POA: Diagnosis not present

## 2014-11-27 DIAGNOSIS — M1990 Unspecified osteoarthritis, unspecified site: Secondary | ICD-10-CM | POA: Diagnosis not present

## 2014-11-27 DIAGNOSIS — Z1389 Encounter for screening for other disorder: Secondary | ICD-10-CM | POA: Diagnosis not present

## 2014-11-27 DIAGNOSIS — M8580 Other specified disorders of bone density and structure, unspecified site: Secondary | ICD-10-CM | POA: Diagnosis not present

## 2014-11-27 DIAGNOSIS — G629 Polyneuropathy, unspecified: Secondary | ICD-10-CM | POA: Diagnosis not present

## 2014-11-29 ENCOUNTER — Emergency Department (HOSPITAL_BASED_OUTPATIENT_CLINIC_OR_DEPARTMENT_OTHER)
Admission: EM | Admit: 2014-11-29 | Discharge: 2014-11-29 | Disposition: A | Discharge status: Home / Self Care | Payer: Medicare Other | Attending: Emergency Medicine | Admitting: Emergency Medicine

## 2014-11-29 ENCOUNTER — Encounter (HOSPITAL_BASED_OUTPATIENT_CLINIC_OR_DEPARTMENT_OTHER): Payer: Self-pay | Admitting: Emergency Medicine

## 2014-11-29 DIAGNOSIS — Z79899 Other long term (current) drug therapy: Secondary | ICD-10-CM | POA: Insufficient documentation

## 2014-11-29 DIAGNOSIS — Y9289 Other specified places as the place of occurrence of the external cause: Secondary | ICD-10-CM | POA: Insufficient documentation

## 2014-11-29 DIAGNOSIS — Z87891 Personal history of nicotine dependence: Secondary | ICD-10-CM | POA: Insufficient documentation

## 2014-11-29 DIAGNOSIS — Y998 Other external cause status: Secondary | ICD-10-CM | POA: Insufficient documentation

## 2014-11-29 DIAGNOSIS — X58XXXA Exposure to other specified factors, initial encounter: Secondary | ICD-10-CM | POA: Diagnosis not present

## 2014-11-29 DIAGNOSIS — E7800 Pure hypercholesterolemia, unspecified: Secondary | ICD-10-CM | POA: Diagnosis not present

## 2014-11-29 DIAGNOSIS — Y9389 Activity, other specified: Secondary | ICD-10-CM | POA: Diagnosis not present

## 2014-11-29 DIAGNOSIS — T7840XA Allergy, unspecified, initial encounter: Secondary | ICD-10-CM | POA: Diagnosis not present

## 2014-11-29 NOTE — Discharge Instructions (Signed)
Please continue taking Benadryl as prescribed over the counter as needed for symptomatic relief.  Please follow up with your primary care provider on Monday. Please return to the Emergency Department if symptoms worsen or new onset of fever, chills, facial swelling, difficulty breathing.

## 2014-11-29 NOTE — ED Notes (Signed)
Per pt report was seen over a week ago by PCP an placed on antibiotic ( unsure of name of drug), took last dose Thursday morning, went to golf course yesterday and rash developed on face and chest this morning that has spread to back and chest and lower legs. Face feels like its on fire. No reports of fevers, chills or body aches.

## 2014-11-29 NOTE — ED Notes (Signed)
Pt reports redness and itching to face and neck that started on Thursday, pt reports she was at golf tournament thur and developed this redness

## 2014-11-29 NOTE — ED Provider Notes (Signed)
CSN: [id number]     Arrival date & time 11/29/14  1403 History   First MD Initiated Contact with Patient 11/29/14 1517     Chief Complaint  Patient presents with  . Allergic Reaction     (Consider location/radiation/quality/duration/timing/severity/associated sxs/prior Treatment) HPI Comments: Pt is a 81 yo female who presents to the ED with complaint of allergic reaction. Pt reports having rash to her face last night and waking up this morning with a rash covering her entire body with associated itching. Denies fever, facial swelling, SOB, dysphagia, CP, abdominal pain, N/V/D, urinary sxs. Pt states that she was seen by her PCP last Thursday for a physical, dx with UTI and was started on keflex and recently finished her rx 3 days ago. She notes she out in the sun yesterday at a gold tournament. She notes using a new sunscreen yesterday on her arms only and denies using any new soaps, detergents, lotions. She notes taking Benadryl this morning at 7:30am with improvement of sxs.   Patient is a 81 y.o. female presenting with allergic reaction.  Allergic Reaction Presenting symptoms: rash     Past Medical History  Diagnosis Date  . Hypercholesteremia    Past Surgical History  Procedure Laterality Date  . Hip surgery    . Toe surgery     History reviewed. No pertinent family history. Social History  Substance Use Topics  . Smoking status: Former Research scientist (life sciences)  . Smokeless tobacco: None  . Alcohol Use: 0.6 oz/week    1 Glasses of wine per week   OB History    No data available     Review of Systems  Skin: Positive for rash.  All other systems reviewed and are negative.     Allergies  Review of patient's allergies indicates no known allergies.  Home Medications   Prior to Admission medications   Medication Sig Start Date End Date Taking? Authorizing Provider  diphenhydrAMINE (BENADRYL) 25 mg capsule Take 25 mg by mouth every 6 (six) hours as needed.   Yes Historical Provider,  MD  atorvastatin (LIPITOR) 10 MG tablet Take 10 mg by mouth daily.    Historical Provider, MD   BP 131/61 mmHg  Pulse 108  Temp(Src) 98.3 F (36.8 C) (Oral)  Resp 20  Ht 5\' 1"  (1.549 m)  Wt 126 lb (57.153 kg)  BMI 23.82 kg/m2  SpO2 94% Physical Exam  Constitutional: She is oriented to person, place, and time. She appears well-developed and well-nourished. No distress.  HENT:  Head: Normocephalic and atraumatic.  Mouth/Throat: Oropharynx is clear and moist. No oropharyngeal exudate.  Eyes: Conjunctivae and EOM are normal. Pupils are equal, round, and reactive to light. Right eye exhibits no discharge. Left eye exhibits no discharge. No scleral icterus.  Neck: Normal range of motion. Neck supple.  Cardiovascular: Normal rate, regular rhythm, normal heart sounds and intact distal pulses.   No murmur heard. Pulmonary/Chest: Effort normal and breath sounds normal. No respiratory distress. She has no wheezes. She has no rales. She exhibits no tenderness.  Abdominal: Soft. Bowel sounds are normal. She exhibits no mass. There is no tenderness. There is no rebound and no guarding.  Musculoskeletal: Normal range of motion. She exhibits no edema or tenderness.  Lymphadenopathy:    She has no cervical adenopathy.  Neurological: She is alert and oriented to person, place, and time.  Skin: Skin is warm and dry. Rash noted. She is not diaphoretic.  Diffuse erythematous macular rash noted to face, neck, back,  chest, abdomen, BUE, BLE with blanching.    Nursing note and vitals reviewed.   ED Course  Procedures (including critical care time) Labs Review Labs Reviewed - No data to display  Imaging Review No results found. I have personally reviewed and evaluated these images and lab results as part of my medical decision-making.  Filed Vitals:   11/29/14 1710  BP: 144/78  Pulse: 88  Temp:   Resp: 20     MDM   Final diagnoses:  Allergic reaction, initial encounter    Pt presents  with diffuse rash  with associated itching. Reports taking Keflex for UTI last week. Denies any other new irritants. Denies facial swelling, fever, chills, SOB. VSS. Diffuse erythematous macular rash noted to face, neck, back, chest, abd, arms and legs with blanching. Rash not at palms/soles. No target lesions. No petechiae, purpura or cellulitis. I do not suspect tic borne rash. I suspect rash is likely due to new antibiotic that pt has since finished taking. Plan to d/c pt home. Advised to pt to continue taking Benadryl OTC as prescribed and to follow up with her PCP in 2 days.  Evaluation does not show pathology requring ongoing emergent intervention or admission. Pt is hemodynamically stable and mentating appropriately. Discussed findings/results and plan with patient/guardian, who agrees with plan. All questions answered. Return precautions discussed and outpatient follow up given.      Chesley Noon [place], [place] 12/01/14 0033  Evelina Bucy, MD 12/01/14 [phone]

## 2014-11-29 NOTE — ED Notes (Addendum)
Pt also reports receiving flu shot on Thursday and only other new exposure was a sunscreen spray that she used at tournament

## 2014-12-23 DIAGNOSIS — Z1212 Encounter for screening for malignant neoplasm of rectum: Secondary | ICD-10-CM | POA: Diagnosis not present

## 2015-03-23 DIAGNOSIS — Z85828 Personal history of other malignant neoplasm of skin: Secondary | ICD-10-CM | POA: Diagnosis not present

## 2015-03-23 DIAGNOSIS — L253 Unspecified contact dermatitis due to other chemical products: Secondary | ICD-10-CM | POA: Diagnosis not present

## 2015-04-01 DIAGNOSIS — H26491 Other secondary cataract, right eye: Secondary | ICD-10-CM | POA: Diagnosis not present

## 2015-04-01 DIAGNOSIS — Z961 Presence of intraocular lens: Secondary | ICD-10-CM | POA: Diagnosis not present

## 2015-04-01 DIAGNOSIS — Z0100 Encounter for examination of eyes and vision without abnormal findings: Secondary | ICD-10-CM | POA: Diagnosis not present

## 2015-05-07 DIAGNOSIS — Z0000 Encounter for general adult medical examination without abnormal findings: Secondary | ICD-10-CM | POA: Diagnosis not present

## 2015-05-07 DIAGNOSIS — N3946 Mixed incontinence: Secondary | ICD-10-CM | POA: Diagnosis not present

## 2015-05-07 DIAGNOSIS — R351 Nocturia: Secondary | ICD-10-CM | POA: Diagnosis not present

## 2015-05-12 DIAGNOSIS — Z85828 Personal history of other malignant neoplasm of skin: Secondary | ICD-10-CM | POA: Diagnosis not present

## 2015-05-12 DIAGNOSIS — L249 Irritant contact dermatitis, unspecified cause: Secondary | ICD-10-CM | POA: Diagnosis not present

## 2015-05-20 DIAGNOSIS — B373 Candidiasis of vulva and vagina: Secondary | ICD-10-CM | POA: Diagnosis not present

## 2015-05-20 DIAGNOSIS — N8111 Cystocele, midline: Secondary | ICD-10-CM | POA: Diagnosis not present

## 2015-05-20 DIAGNOSIS — N7689 Other specified inflammation of vagina and vulva: Secondary | ICD-10-CM | POA: Diagnosis not present

## 2015-05-20 DIAGNOSIS — R350 Frequency of micturition: Secondary | ICD-10-CM | POA: Diagnosis not present

## 2015-05-28 DIAGNOSIS — Z6825 Body mass index (BMI) 25.0-25.9, adult: Secondary | ICD-10-CM | POA: Diagnosis not present

## 2015-05-28 DIAGNOSIS — L089 Local infection of the skin and subcutaneous tissue, unspecified: Secondary | ICD-10-CM | POA: Diagnosis not present

## 2015-05-28 DIAGNOSIS — S81811A Laceration without foreign body, right lower leg, initial encounter: Secondary | ICD-10-CM | POA: Diagnosis not present

## 2015-05-28 DIAGNOSIS — S81811S Laceration without foreign body, right lower leg, sequela: Secondary | ICD-10-CM | POA: Diagnosis not present

## 2015-06-20 DIAGNOSIS — L03115 Cellulitis of right lower limb: Secondary | ICD-10-CM | POA: Diagnosis not present

## 2015-06-22 DIAGNOSIS — L720 Epidermal cyst: Secondary | ICD-10-CM | POA: Diagnosis not present

## 2015-06-22 DIAGNOSIS — Z85828 Personal history of other malignant neoplasm of skin: Secondary | ICD-10-CM | POA: Diagnosis not present

## 2015-06-22 DIAGNOSIS — L821 Other seborrheic keratosis: Secondary | ICD-10-CM | POA: Diagnosis not present

## 2015-06-22 DIAGNOSIS — L570 Actinic keratosis: Secondary | ICD-10-CM | POA: Diagnosis not present

## 2015-06-22 DIAGNOSIS — D225 Melanocytic nevi of trunk: Secondary | ICD-10-CM | POA: Diagnosis not present

## 2015-06-22 DIAGNOSIS — D692 Other nonthrombocytopenic purpura: Secondary | ICD-10-CM | POA: Diagnosis not present

## 2015-07-20 DIAGNOSIS — N393 Stress incontinence (female) (male): Secondary | ICD-10-CM | POA: Diagnosis not present

## 2015-07-20 DIAGNOSIS — N8111 Cystocele, midline: Secondary | ICD-10-CM | POA: Diagnosis not present

## 2015-07-24 DIAGNOSIS — R599 Enlarged lymph nodes, unspecified: Secondary | ICD-10-CM | POA: Diagnosis not present

## 2015-07-24 DIAGNOSIS — Z6824 Body mass index (BMI) 24.0-24.9, adult: Secondary | ICD-10-CM | POA: Diagnosis not present

## 2015-07-24 DIAGNOSIS — N8110 Cystocele, unspecified: Secondary | ICD-10-CM | POA: Diagnosis not present

## 2015-08-27 DIAGNOSIS — N8111 Cystocele, midline: Secondary | ICD-10-CM | POA: Diagnosis not present

## 2015-08-27 DIAGNOSIS — N3946 Mixed incontinence: Secondary | ICD-10-CM | POA: Diagnosis not present

## 2015-10-19 DIAGNOSIS — N8111 Cystocele, midline: Secondary | ICD-10-CM | POA: Diagnosis not present

## 2015-11-09 DIAGNOSIS — E039 Hypothyroidism, unspecified: Secondary | ICD-10-CM | POA: Diagnosis not present

## 2015-11-09 DIAGNOSIS — N8111 Cystocele, midline: Secondary | ICD-10-CM | POA: Diagnosis not present

## 2015-11-09 DIAGNOSIS — Z0000 Encounter for general adult medical examination without abnormal findings: Secondary | ICD-10-CM | POA: Diagnosis not present

## 2015-11-09 DIAGNOSIS — N3946 Mixed incontinence: Secondary | ICD-10-CM | POA: Diagnosis not present

## 2015-11-12 DIAGNOSIS — E785 Hyperlipidemia, unspecified: Secondary | ICD-10-CM | POA: Diagnosis not present

## 2015-11-12 DIAGNOSIS — E039 Hypothyroidism, unspecified: Secondary | ICD-10-CM | POA: Diagnosis not present

## 2015-11-12 DIAGNOSIS — Z0000 Encounter for general adult medical examination without abnormal findings: Secondary | ICD-10-CM | POA: Diagnosis not present

## 2015-12-02 DIAGNOSIS — Z6824 Body mass index (BMI) 24.0-24.9, adult: Secondary | ICD-10-CM | POA: Diagnosis not present

## 2015-12-02 DIAGNOSIS — J309 Allergic rhinitis, unspecified: Secondary | ICD-10-CM | POA: Diagnosis not present

## 2015-12-02 DIAGNOSIS — M859 Disorder of bone density and structure, unspecified: Secondary | ICD-10-CM | POA: Diagnosis not present

## 2015-12-02 DIAGNOSIS — E038 Other specified hypothyroidism: Secondary | ICD-10-CM | POA: Diagnosis not present

## 2015-12-02 DIAGNOSIS — Z1389 Encounter for screening for other disorder: Secondary | ICD-10-CM | POA: Diagnosis not present

## 2015-12-02 DIAGNOSIS — N8110 Cystocele, unspecified: Secondary | ICD-10-CM | POA: Diagnosis not present

## 2015-12-02 DIAGNOSIS — M1990 Unspecified osteoarthritis, unspecified site: Secondary | ICD-10-CM | POA: Diagnosis not present

## 2015-12-02 DIAGNOSIS — Z23 Encounter for immunization: Secondary | ICD-10-CM | POA: Diagnosis not present

## 2015-12-02 DIAGNOSIS — F325 Major depressive disorder, single episode, in full remission: Secondary | ICD-10-CM | POA: Diagnosis not present

## 2015-12-02 DIAGNOSIS — J449 Chronic obstructive pulmonary disease, unspecified: Secondary | ICD-10-CM | POA: Diagnosis not present

## 2015-12-02 DIAGNOSIS — G629 Polyneuropathy, unspecified: Secondary | ICD-10-CM | POA: Diagnosis not present

## 2015-12-02 DIAGNOSIS — Z0000 Encounter for general adult medical examination without abnormal findings: Secondary | ICD-10-CM | POA: Diagnosis not present

## 2015-12-10 DIAGNOSIS — N3946 Mixed incontinence: Secondary | ICD-10-CM | POA: Diagnosis not present

## 2015-12-10 DIAGNOSIS — R351 Nocturia: Secondary | ICD-10-CM | POA: Diagnosis not present

## 2015-12-15 DIAGNOSIS — H903 Sensorineural hearing loss, bilateral: Secondary | ICD-10-CM | POA: Diagnosis not present

## 2015-12-22 DIAGNOSIS — N8111 Cystocele, midline: Secondary | ICD-10-CM | POA: Diagnosis not present

## 2015-12-22 DIAGNOSIS — N952 Postmenopausal atrophic vaginitis: Secondary | ICD-10-CM | POA: Diagnosis not present

## 2016-01-11 DIAGNOSIS — L821 Other seborrheic keratosis: Secondary | ICD-10-CM | POA: Diagnosis not present

## 2016-01-11 DIAGNOSIS — L570 Actinic keratosis: Secondary | ICD-10-CM | POA: Diagnosis not present

## 2016-01-11 DIAGNOSIS — D1801 Hemangioma of skin and subcutaneous tissue: Secondary | ICD-10-CM | POA: Diagnosis not present

## 2016-01-11 DIAGNOSIS — Z85828 Personal history of other malignant neoplasm of skin: Secondary | ICD-10-CM | POA: Diagnosis not present

## 2016-03-03 DIAGNOSIS — N8111 Cystocele, midline: Secondary | ICD-10-CM | POA: Diagnosis not present

## 2016-03-03 DIAGNOSIS — N952 Postmenopausal atrophic vaginitis: Secondary | ICD-10-CM | POA: Diagnosis not present

## 2016-04-17 ENCOUNTER — Encounter (HOSPITAL_BASED_OUTPATIENT_CLINIC_OR_DEPARTMENT_OTHER): Payer: Self-pay | Admitting: *Deleted

## 2016-04-17 ENCOUNTER — Emergency Department (HOSPITAL_BASED_OUTPATIENT_CLINIC_OR_DEPARTMENT_OTHER)
Admission: EM | Admit: 2016-04-17 | Discharge: 2016-04-17 | Disposition: A | Discharge status: Home / Self Care | Payer: Medicare Other | Attending: Emergency Medicine | Admitting: Emergency Medicine

## 2016-04-17 DIAGNOSIS — H6122 Impacted cerumen, left ear: Secondary | ICD-10-CM | POA: Diagnosis not present

## 2016-04-17 DIAGNOSIS — R404 Transient alteration of awareness: Secondary | ICD-10-CM | POA: Diagnosis not present

## 2016-04-17 DIAGNOSIS — H6591 Unspecified nonsuppurative otitis media, right ear: Secondary | ICD-10-CM | POA: Insufficient documentation

## 2016-04-17 DIAGNOSIS — H81399 Other peripheral vertigo, unspecified ear: Secondary | ICD-10-CM

## 2016-04-17 DIAGNOSIS — Z79899 Other long term (current) drug therapy: Secondary | ICD-10-CM | POA: Diagnosis not present

## 2016-04-17 DIAGNOSIS — R42 Dizziness and giddiness: Secondary | ICD-10-CM | POA: Diagnosis present

## 2016-04-17 DIAGNOSIS — H81392 Other peripheral vertigo, left ear: Secondary | ICD-10-CM | POA: Diagnosis not present

## 2016-04-17 DIAGNOSIS — Z87891 Personal history of nicotine dependence: Secondary | ICD-10-CM | POA: Insufficient documentation

## 2016-04-17 HISTORY — DX: Urgency of urination: R39.15

## 2016-04-17 HISTORY — DX: Dizziness and giddiness: R42

## 2016-04-17 LAB — CBC WITH DIFFERENTIAL/PLATELET
Basophils Absolute: 0 10*3/uL (ref 0.0–0.1)
Basophils Relative: 0 %
EOS ABS: 0.1 10*3/uL (ref 0.0–0.7)
Eosinophils Relative: 1 %
HEMATOCRIT: 42.7 % (ref 36.0–46.0)
HEMOGLOBIN: 14.8 g/dL (ref 12.0–15.0)
LYMPHS ABS: 0.8 10*3/uL (ref 0.7–4.0)
Lymphocytes Relative: 17 %
MCH: 30.9 pg (ref 26.0–34.0)
MCHC: 34.7 g/dL (ref 30.0–36.0)
MCV: 89.1 fL (ref 78.0–100.0)
MONO ABS: 0.5 10*3/uL (ref 0.1–1.0)
MONOS PCT: 10 %
NEUTROS PCT: 72 %
Neutro Abs: 3.4 10*3/uL (ref 1.7–7.7)
Platelets: 204 10*3/uL (ref 150–400)
RBC: 4.79 MIL/uL (ref 3.87–5.11)
RDW: 13.4 % (ref 11.5–15.5)
WBC: 4.7 10*3/uL (ref 4.0–10.5)

## 2016-04-17 LAB — COMPREHENSIVE METABOLIC PANEL
ALK PHOS: 46 U/L (ref 38–126)
ALT: 22 U/L (ref 14–54)
ANION GAP: 8 (ref 5–15)
AST: 29 U/L (ref 15–41)
Albumin: 4.4 g/dL (ref 3.5–5.0)
BILIRUBIN TOTAL: 0.9 mg/dL (ref 0.3–1.2)
BUN: 14 mg/dL (ref 6–20)
CALCIUM: 9.5 mg/dL (ref 8.9–10.3)
CO2: 25 mmol/L (ref 22–32)
Chloride: 103 mmol/L (ref 101–111)
Creatinine, Ser: 0.56 mg/dL (ref 0.44–1.00)
GFR calc non Af Amer: >60 mL/min (ref >60)
Glucose, Bld: 105 mg/dL — ABNORMAL HIGH (ref 65–99)
Potassium: 4.3 mmol/L (ref 3.5–5.1)
SODIUM: 136 mmol/L (ref 135–145)
TOTAL PROTEIN: 7.2 g/dL (ref 6.5–8.1)

## 2016-04-17 LAB — URINALYSIS, ROUTINE W REFLEX MICROSCOPIC
Bilirubin Urine: NEGATIVE
Glucose, UA: NEGATIVE mg/dL
Hgb urine dipstick: NEGATIVE
KETONES UR: 15 mg/dL — AB
NITRITE: NEGATIVE
PROTEIN: NEGATIVE mg/dL
Specific Gravity, Urine: 1.009 (ref 1.005–1.030)
pH: 6.5 (ref 5.0–8.0)

## 2016-04-17 LAB — URINALYSIS, MICROSCOPIC (REFLEX)

## 2016-04-17 MED ORDER — DOCUSATE SODIUM 50 MG/5ML PO LIQD
ORAL | Status: AC
  Administered 2016-04-17: 100 mg via ORAL
  Filled 2016-04-17: qty 10

## 2016-04-17 MED ORDER — DOCUSATE SODIUM 50 MG/5ML PO LIQD
100.0000 mg | Freq: Once | ORAL | Status: AC
  Administered 2016-04-17: 100 mg via ORAL

## 2016-04-17 MED ORDER — MECLIZINE HCL 25 MG PO TABS
25.0000 mg | ORAL_TABLET | Freq: Once | ORAL | Status: AC
  Administered 2016-04-17: 25 mg via ORAL
  Filled 2016-04-17: qty 1

## 2016-04-17 MED ORDER — MECLIZINE HCL 25 MG PO TABS: 25.0000 mg | ORAL_TABLET | Freq: Two times a day (BID) | ORAL | 0 refills | Status: DC | PRN

## 2016-04-17 NOTE — ED Provider Notes (Signed)
[facility] Provider Note   CSN: [id number] Arrival date & time: 04/17/16  [id number]     History   Chief Complaint Chief Complaint  Patient presents with  . Dizziness    HPI Carmen Roy is a 83 y.o. female.  Patient is a 83 year old relatively healthy female presenting today with the complaint of dizziness that started around 9 or 10:00 last night when she went to get ready for bed. She describes a feeling of things slightly moving in her visual field which is worse when she walks. When she walks she feels like she needs to hold onto something. She denies unilateral numbness, weakness, vision changes, speech difficulty. No recent fever or congestion. She denies any urinary symptoms. No chest pain or shortness of breath. She does note her blood pressure has been elevated today which is unusual. She also does have a history of inner ear problems in the past but cannot remember what those felt like.   The history is provided by the patient.  Dizziness  Quality:  Imbalance and room spinning Severity:  Moderate Onset quality:  Sudden Duration:  12 hours Timing:  Constant Progression:  Unchanged Chronicity:  New Context comment:  Started when she stood up to get ready for bed Relieved by:  Being still Worsened by:  Standing up and turning head (walking) Ineffective treatments:  None tried Associated symptoms: no chest pain, no diarrhea, no nausea, no palpitations, no shortness of breath, no syncope, no vision changes and no weakness   Associated symptoms comment:  Ears feel full Risk factors: hx of vertigo   Risk factors: no heart disease and no hx of stroke     Past Medical History:  Diagnosis Date  . Hypercholesteremia   . Urinary urgency    Currently on experimental study with trigger point and vibration for 12 weeks, 04/17/16  . Vertigo     There are no active problems to display for this patient.   Past Surgical History:  Procedure Laterality Date  . HIP  SURGERY    . TOE SURGERY      OB History    No data available       Home Medications    Prior to Admission medications   Medication Sig Start Date End Date Taking? Authorizing Provider  atorvastatin (LIPITOR) 10 MG tablet Take 10 mg by mouth daily.   Yes Historical Provider, MD  Calcium Citrate (CAL-CITRATE PO) Take by mouth.   Yes Historical Provider, MD  LYSINE PO Take by mouth.   Yes Historical Provider, MD  Multiple Vitamin (MULTIVITAMIN) capsule Take 1 capsule by mouth daily.   Yes Historical Provider, MD  diphenhydrAMINE (BENADRYL) 25 mg capsule Take 25 mg by mouth every 6 (six) hours as needed.    Historical Provider, MD    Family History No family history on file.  Social History Social History  Substance Use Topics  . Smoking status: Former Research scientist (life sciences)  . Smokeless tobacco: Never Used  . Alcohol use 0.6 oz/week    1 Glasses of wine per week     Allergies   Patient has no known allergies.   Review of Systems Review of Systems  Respiratory: Negative for shortness of breath.   Cardiovascular: Negative for chest pain, palpitations and syncope.  Gastrointestinal: Negative for diarrhea and nausea.  Neurological: Positive for dizziness. Negative for weakness.  All other systems reviewed and are negative.    Physical Exam Updated Vital Signs BP 162/78 (BP Location: Right Arm)  Pulse 78   Temp 97.8 F (36.6 C)   Resp 18   Ht 5\' 1"  (1.549 m)   Wt 127 lb (57.6 kg)   SpO2 97%   BMI 24.00 kg/m   Physical Exam  Constitutional: She is oriented to person, place, and time. She appears well-developed and well-nourished. No distress.  HENT:  Head: Normocephalic and atraumatic.  Right Ear: A middle ear effusion is present.  Mouth/Throat: Oropharynx is clear and moist.  Left ear cerumen impaction  Eyes: Conjunctivae and EOM are normal. Pupils are equal, round, and reactive to light.  Neck: Normal range of motion. Neck supple.  Cardiovascular: Normal rate, regular  rhythm and intact distal pulses.   No murmur heard. Pulmonary/Chest: Effort normal and breath sounds normal. No respiratory distress. She has no wheezes. She has no rales.  Abdominal: Soft. She exhibits no distension. There is no tenderness. There is no rebound and no guarding.  Musculoskeletal: Normal range of motion. She exhibits no edema or tenderness.  Hammer toe on the left 2nd toe  Neurological: She is alert and oriented to person, place, and time. She has normal strength. No cranial nerve deficit or sensory deficit.  No nystagmus.  She is cautious with walking and walks slowly but is not frankly ataxic. Heel-to-shin on the right is normal. Heel to shin on the left with mild difficulty. No pronator drift. No visual field cuts  Skin: Skin is warm and dry. No rash noted. No erythema.  Psychiatric: She has a normal mood and affect. Her behavior is normal.  Nursing note and vitals reviewed.    ED Treatments / Results  Labs (all labs ordered are listed, but only abnormal results are displayed) Labs Reviewed  COMPREHENSIVE METABOLIC PANEL - Abnormal; Notable for the following:       Result Value   Glucose, Bld 105 (*)    All other components within normal limits  URINALYSIS, ROUTINE W REFLEX MICROSCOPIC - Abnormal; Notable for the following:    Ketones, ur 15 (*)    Leukocytes, UA SMALL (*)    All other components within normal limits  URINALYSIS, MICROSCOPIC (REFLEX) - Abnormal; Notable for the following:    Bacteria, UA MANY (*)    Squamous Epithelial / LPF 0-5 (*)    All other components within normal limits  CBC WITH DIFFERENTIAL/PLATELET    EKG  EKG Interpretation  Date/Time:  Sunday April 17 2016 08:32:08 EST Ventricular Rate:  74 PR Interval:    QRS Duration: 89 QT Interval:  398 QTC Calculation: 442 R Axis:   -15 Text Interpretation:  Sinus rhythm Borderline left axis deviation No significant change since last tracing Confirmed by Maryan Rued  MD, Loree Fee ([id number]) on  04/17/2016 8:50:16 AM       Radiology No results found.  Procedures Procedures (including critical care time)  Medications Ordered in ED Medications  meclizine (ANTIVERT) tablet 25 mg (25 mg Oral Given 04/17/16 0921)  docusate (COLACE) 50 MG/5ML liquid 100 mg (100 mg Oral Given 04/17/16 1053)     Initial Impression / Assessment and Plan / ED Course  I have reviewed the triage vital signs and the nursing notes.  Pertinent labs & imaging results that were available during my care of the patient were reviewed by me and considered in my medical decision making (see chart for details).   Patient is a 83 year old female with symptoms today of feeling dizzy which is worse with walking and feeling that her ears are full. She does  have a prior history of peripheral vertigo but cannot recall how she felt with that. She denies any nausea or vomiting. Exam is relatively benign except some mild difficulty with heel to shin with the left foot only. No frank ataxia with walking and a family member states her gait today looks the same as normal.  Patient does have cerumen impaction on the left and mild fluid in the right TM. This could be peripheral in nature. However will check electrolytes, UA and give meclizine. If patient's symptoms do not improve and labs are otherwise normal feel that she may need an MRI to rule out stroke. She is outside of any window for TPA as her symptoms started over 12 hours ago.   10:32 AM Labs reassuring.  EKg without changes.  Pt feeling better after meclizine.  Will remove cerumen from the left ear and will attempt to walk again.  BP improved to 145/80.  11:39 AM Attempting to flush the ear and was some pain and bleeding but wax not fully removed  Concern for possible TM perforation unable to see to evaluate.  Pt walked without difficulty after meclizine and feels she is back to baseline.  Stressed importance of PCP f/u and return if symptoms worsen.  Final Clinical  Impressions(s) / ED Diagnoses   Final diagnoses:  Peripheral vertigo, unspecified laterality    New Prescriptions New Prescriptions   MECLIZINE (ANTIVERT) 25 MG TABLET    Take 1 tablet (25 mg total) by mouth 2 (two) times daily as needed for dizziness.     Blanchie Dessert, MD 04/17/16 1146

## 2016-04-17 NOTE — ED Notes (Signed)
Pt is HOH, left hearing aids at home.

## 2016-04-17 NOTE — ED Notes (Signed)
ED Provider at bedside. 

## 2016-04-17 NOTE — ED Notes (Signed)
Pt ambulated to restroom, denies dizziness. Pt denies ear pain.

## 2016-04-17 NOTE — ED Triage Notes (Addendum)
[facility] reports the patient developed acute onset of dizziness last night at 2000.  Has a history of vertigo.  Patient states this does not feel like her normal vertigo.  VSS.  No stroke like symptoms noted.  No injury. Reports the patient did not take any medications for her vertigo.  B/P 170/90, HR 76, O2sat 99, RR 16, CBG: 111.  No orthostatic changes.

## 2016-04-17 NOTE — ED Notes (Addendum)
Pt L ear irrigated with elephant wash. Pt reports hearing better, very little was came out with a small amount of blood. EDP notified

## 2016-04-20 DIAGNOSIS — H9192 Unspecified hearing loss, left ear: Secondary | ICD-10-CM | POA: Diagnosis not present

## 2016-04-20 DIAGNOSIS — Z6824 Body mass index (BMI) 24.0-24.9, adult: Secondary | ICD-10-CM | POA: Diagnosis not present

## 2016-04-20 DIAGNOSIS — H6122 Impacted cerumen, left ear: Secondary | ICD-10-CM | POA: Diagnosis not present

## 2016-04-20 DIAGNOSIS — R42 Dizziness and giddiness: Secondary | ICD-10-CM | POA: Diagnosis not present

## 2016-04-28 DIAGNOSIS — H5203 Hypermetropia, bilateral: Secondary | ICD-10-CM | POA: Diagnosis not present

## 2016-04-28 DIAGNOSIS — H1851 Endothelial corneal dystrophy: Secondary | ICD-10-CM | POA: Diagnosis not present

## 2016-04-28 DIAGNOSIS — Z961 Presence of intraocular lens: Secondary | ICD-10-CM | POA: Diagnosis not present

## 2016-05-05 DIAGNOSIS — H903 Sensorineural hearing loss, bilateral: Secondary | ICD-10-CM | POA: Diagnosis not present

## 2016-05-05 DIAGNOSIS — R2689 Other abnormalities of gait and mobility: Secondary | ICD-10-CM | POA: Diagnosis not present

## 2016-06-07 DIAGNOSIS — N8111 Cystocele, midline: Secondary | ICD-10-CM | POA: Diagnosis not present

## 2016-07-14 DIAGNOSIS — R339 Retention of urine, unspecified: Secondary | ICD-10-CM | POA: Diagnosis not present

## 2016-07-14 DIAGNOSIS — N8111 Cystocele, midline: Secondary | ICD-10-CM | POA: Diagnosis not present

## 2016-08-19 DIAGNOSIS — G43909 Migraine, unspecified, not intractable, without status migrainosus: Secondary | ICD-10-CM | POA: Diagnosis not present

## 2016-08-25 DIAGNOSIS — G43909 Migraine, unspecified, not intractable, without status migrainosus: Secondary | ICD-10-CM | POA: Diagnosis not present

## 2016-08-25 DIAGNOSIS — N8111 Cystocele, midline: Secondary | ICD-10-CM | POA: Diagnosis not present

## 2016-11-21 DIAGNOSIS — Z85828 Personal history of other malignant neoplasm of skin: Secondary | ICD-10-CM | POA: Diagnosis not present

## 2016-11-21 DIAGNOSIS — D485 Neoplasm of uncertain behavior of skin: Secondary | ICD-10-CM | POA: Diagnosis not present

## 2016-11-21 DIAGNOSIS — L820 Inflamed seborrheic keratosis: Secondary | ICD-10-CM | POA: Diagnosis not present

## 2016-11-24 DIAGNOSIS — N8111 Cystocele, midline: Secondary | ICD-10-CM | POA: Diagnosis not present

## 2016-12-01 DIAGNOSIS — Z0000 Encounter for general adult medical examination without abnormal findings: Secondary | ICD-10-CM | POA: Diagnosis not present

## 2016-12-01 DIAGNOSIS — E039 Hypothyroidism, unspecified: Secondary | ICD-10-CM | POA: Diagnosis not present

## 2016-12-01 DIAGNOSIS — M8580 Other specified disorders of bone density and structure, unspecified site: Secondary | ICD-10-CM | POA: Diagnosis not present

## 2016-12-01 DIAGNOSIS — E7800 Pure hypercholesterolemia, unspecified: Secondary | ICD-10-CM | POA: Diagnosis not present

## 2016-12-05 DIAGNOSIS — N8110 Cystocele, unspecified: Secondary | ICD-10-CM | POA: Diagnosis not present

## 2016-12-05 DIAGNOSIS — G6289 Other specified polyneuropathies: Secondary | ICD-10-CM | POA: Diagnosis not present

## 2016-12-05 DIAGNOSIS — J449 Chronic obstructive pulmonary disease, unspecified: Secondary | ICD-10-CM | POA: Diagnosis not present

## 2016-12-05 DIAGNOSIS — E038 Other specified hypothyroidism: Secondary | ICD-10-CM | POA: Diagnosis not present

## 2016-12-05 DIAGNOSIS — J3089 Other allergic rhinitis: Secondary | ICD-10-CM | POA: Diagnosis not present

## 2016-12-05 DIAGNOSIS — M1990 Unspecified osteoarthritis, unspecified site: Secondary | ICD-10-CM | POA: Diagnosis not present

## 2016-12-05 DIAGNOSIS — Z1389 Encounter for screening for other disorder: Secondary | ICD-10-CM | POA: Diagnosis not present

## 2016-12-05 DIAGNOSIS — M859 Disorder of bone density and structure, unspecified: Secondary | ICD-10-CM | POA: Diagnosis not present

## 2016-12-05 DIAGNOSIS — Z0000 Encounter for general adult medical examination without abnormal findings: Secondary | ICD-10-CM | POA: Diagnosis not present

## 2016-12-05 DIAGNOSIS — Z23 Encounter for immunization: Secondary | ICD-10-CM | POA: Diagnosis not present

## 2016-12-05 DIAGNOSIS — F325 Major depressive disorder, single episode, in full remission: Secondary | ICD-10-CM | POA: Diagnosis not present

## 2016-12-05 DIAGNOSIS — H9192 Unspecified hearing loss, left ear: Secondary | ICD-10-CM | POA: Diagnosis not present

## 2016-12-05 DIAGNOSIS — Z6825 Body mass index (BMI) 25.0-25.9, adult: Secondary | ICD-10-CM | POA: Diagnosis not present

## 2017-01-10 DIAGNOSIS — L570 Actinic keratosis: Secondary | ICD-10-CM | POA: Diagnosis not present

## 2017-01-10 DIAGNOSIS — D485 Neoplasm of uncertain behavior of skin: Secondary | ICD-10-CM | POA: Diagnosis not present

## 2017-01-10 DIAGNOSIS — L821 Other seborrheic keratosis: Secondary | ICD-10-CM | POA: Diagnosis not present

## 2017-01-10 DIAGNOSIS — C4441 Basal cell carcinoma of skin of scalp and neck: Secondary | ICD-10-CM | POA: Diagnosis not present

## 2017-01-10 DIAGNOSIS — Z85828 Personal history of other malignant neoplasm of skin: Secondary | ICD-10-CM | POA: Diagnosis not present

## 2017-02-14 DIAGNOSIS — N8111 Cystocele, midline: Secondary | ICD-10-CM | POA: Diagnosis not present

## 2017-05-02 DIAGNOSIS — N8111 Cystocele, midline: Secondary | ICD-10-CM | POA: Diagnosis not present

## 2017-05-03 DIAGNOSIS — G43909 Migraine, unspecified, not intractable, without status migrainosus: Secondary | ICD-10-CM | POA: Diagnosis not present

## 2017-05-03 DIAGNOSIS — H5203 Hypermetropia, bilateral: Secondary | ICD-10-CM | POA: Diagnosis not present

## 2017-05-03 DIAGNOSIS — Z961 Presence of intraocular lens: Secondary | ICD-10-CM | POA: Diagnosis not present

## 2017-05-03 DIAGNOSIS — H1851 Endothelial corneal dystrophy: Secondary | ICD-10-CM | POA: Diagnosis not present

## 2017-05-11 DIAGNOSIS — N8111 Cystocele, midline: Secondary | ICD-10-CM | POA: Diagnosis not present

## 2017-06-09 DIAGNOSIS — T3695XA Adverse effect of unspecified systemic antibiotic, initial encounter: Secondary | ICD-10-CM | POA: Diagnosis not present

## 2017-06-09 DIAGNOSIS — L03116 Cellulitis of left lower limb: Secondary | ICD-10-CM | POA: Diagnosis not present

## 2017-06-09 DIAGNOSIS — B379 Candidiasis, unspecified: Secondary | ICD-10-CM | POA: Diagnosis not present

## 2017-06-30 DIAGNOSIS — S81812D Laceration without foreign body, left lower leg, subsequent encounter: Secondary | ICD-10-CM | POA: Diagnosis not present

## 2017-06-30 DIAGNOSIS — L03116 Cellulitis of left lower limb: Secondary | ICD-10-CM | POA: Diagnosis not present

## 2017-08-09 DIAGNOSIS — N898 Other specified noninflammatory disorders of vagina: Secondary | ICD-10-CM | POA: Diagnosis not present

## 2017-08-09 DIAGNOSIS — N8111 Cystocele, midline: Secondary | ICD-10-CM | POA: Diagnosis not present

## 2017-08-22 DIAGNOSIS — N8111 Cystocele, midline: Secondary | ICD-10-CM | POA: Diagnosis not present

## 2017-08-22 DIAGNOSIS — N898 Other specified noninflammatory disorders of vagina: Secondary | ICD-10-CM | POA: Diagnosis not present

## 2017-08-23 DIAGNOSIS — N898 Other specified noninflammatory disorders of vagina: Secondary | ICD-10-CM | POA: Diagnosis not present

## 2017-08-23 DIAGNOSIS — N8111 Cystocele, midline: Secondary | ICD-10-CM | POA: Diagnosis not present

## 2017-09-05 DIAGNOSIS — K5900 Constipation, unspecified: Secondary | ICD-10-CM | POA: Diagnosis not present

## 2017-09-05 DIAGNOSIS — R1904 Left lower quadrant abdominal swelling, mass and lump: Secondary | ICD-10-CM | POA: Diagnosis not present

## 2017-09-05 DIAGNOSIS — R599 Enlarged lymph nodes, unspecified: Secondary | ICD-10-CM | POA: Diagnosis not present

## 2017-09-08 ENCOUNTER — Other Ambulatory Visit: Payer: Self-pay | Admitting: Internal Medicine

## 2017-09-08 DIAGNOSIS — K5900 Constipation, unspecified: Secondary | ICD-10-CM

## 2017-09-08 DIAGNOSIS — R1904 Left lower quadrant abdominal swelling, mass and lump: Secondary | ICD-10-CM

## 2017-09-26 ENCOUNTER — Ambulatory Visit
Admission: RE | Admit: 2017-09-26 | Discharge: 2017-09-26 | Disposition: A | Discharge status: Home / Self Care | Payer: Medicare Other | Source: Ambulatory Visit | Attending: Internal Medicine | Admitting: Internal Medicine

## 2017-09-26 DIAGNOSIS — R1904 Left lower quadrant abdominal swelling, mass and lump: Secondary | ICD-10-CM

## 2017-09-26 DIAGNOSIS — K5900 Constipation, unspecified: Secondary | ICD-10-CM

## 2017-09-26 DIAGNOSIS — K4090 Unilateral inguinal hernia, without obstruction or gangrene, not specified as recurrent: Secondary | ICD-10-CM | POA: Diagnosis not present

## 2017-09-26 MED ORDER — IOPAMIDOL (ISOVUE-300) INJECTION 61%
100.0000 mL | Freq: Once | INTRAVENOUS | Status: AC | PRN
  Administered 2017-09-26: 100 mL via INTRAVENOUS

## 2017-10-23 DIAGNOSIS — N8111 Cystocele, midline: Secondary | ICD-10-CM | POA: Diagnosis not present

## 2017-11-14 DIAGNOSIS — H903 Sensorineural hearing loss, bilateral: Secondary | ICD-10-CM | POA: Diagnosis not present

## 2017-12-14 DIAGNOSIS — E039 Hypothyroidism, unspecified: Secondary | ICD-10-CM | POA: Diagnosis not present

## 2017-12-14 DIAGNOSIS — E785 Hyperlipidemia, unspecified: Secondary | ICD-10-CM | POA: Diagnosis not present

## 2017-12-14 DIAGNOSIS — E559 Vitamin D deficiency, unspecified: Secondary | ICD-10-CM | POA: Diagnosis not present

## 2017-12-18 DIAGNOSIS — H9193 Unspecified hearing loss, bilateral: Secondary | ICD-10-CM | POA: Diagnosis not present

## 2017-12-18 DIAGNOSIS — Z1389 Encounter for screening for other disorder: Secondary | ICD-10-CM | POA: Diagnosis not present

## 2017-12-18 DIAGNOSIS — M1990 Unspecified osteoarthritis, unspecified site: Secondary | ICD-10-CM | POA: Diagnosis not present

## 2017-12-18 DIAGNOSIS — G6289 Other specified polyneuropathies: Secondary | ICD-10-CM | POA: Diagnosis not present

## 2017-12-18 DIAGNOSIS — F325 Major depressive disorder, single episode, in full remission: Secondary | ICD-10-CM | POA: Diagnosis not present

## 2017-12-18 DIAGNOSIS — E7800 Pure hypercholesterolemia, unspecified: Secondary | ICD-10-CM | POA: Diagnosis not present

## 2017-12-18 DIAGNOSIS — Z6825 Body mass index (BMI) 25.0-25.9, adult: Secondary | ICD-10-CM | POA: Diagnosis not present

## 2017-12-18 DIAGNOSIS — M859 Disorder of bone density and structure, unspecified: Secondary | ICD-10-CM | POA: Diagnosis not present

## 2017-12-18 DIAGNOSIS — Z0000 Encounter for general adult medical examination without abnormal findings: Secondary | ICD-10-CM | POA: Diagnosis not present

## 2017-12-18 DIAGNOSIS — J449 Chronic obstructive pulmonary disease, unspecified: Secondary | ICD-10-CM | POA: Diagnosis not present

## 2017-12-18 DIAGNOSIS — E038 Other specified hypothyroidism: Secondary | ICD-10-CM | POA: Diagnosis not present

## 2017-12-18 DIAGNOSIS — E559 Vitamin D deficiency, unspecified: Secondary | ICD-10-CM | POA: Diagnosis not present

## 2017-12-25 DIAGNOSIS — N8111 Cystocele, midline: Secondary | ICD-10-CM | POA: Diagnosis not present

## 2018-01-11 DIAGNOSIS — Z85828 Personal history of other malignant neoplasm of skin: Secondary | ICD-10-CM | POA: Diagnosis not present

## 2018-01-11 DIAGNOSIS — D225 Melanocytic nevi of trunk: Secondary | ICD-10-CM | POA: Diagnosis not present

## 2018-01-11 DIAGNOSIS — D2261 Melanocytic nevi of right upper limb, including shoulder: Secondary | ICD-10-CM | POA: Diagnosis not present

## 2018-01-11 DIAGNOSIS — L821 Other seborrheic keratosis: Secondary | ICD-10-CM | POA: Diagnosis not present

## 2018-02-19 DIAGNOSIS — L989 Disorder of the skin and subcutaneous tissue, unspecified: Secondary | ICD-10-CM | POA: Diagnosis not present

## 2018-03-05 DIAGNOSIS — N898 Other specified noninflammatory disorders of vagina: Secondary | ICD-10-CM | POA: Diagnosis not present

## 2018-03-05 DIAGNOSIS — N8111 Cystocele, midline: Secondary | ICD-10-CM | POA: Diagnosis not present

## 2018-03-05 DIAGNOSIS — N952 Postmenopausal atrophic vaginitis: Secondary | ICD-10-CM | POA: Diagnosis not present

## 2018-04-03 DIAGNOSIS — N8111 Cystocele, midline: Secondary | ICD-10-CM | POA: Diagnosis not present

## 2018-05-07 DIAGNOSIS — Z961 Presence of intraocular lens: Secondary | ICD-10-CM | POA: Diagnosis not present

## 2018-05-07 DIAGNOSIS — H1851 Endothelial corneal dystrophy: Secondary | ICD-10-CM | POA: Diagnosis not present

## 2018-05-07 DIAGNOSIS — H5202 Hypermetropia, left eye: Secondary | ICD-10-CM | POA: Diagnosis not present

## 2018-05-07 DIAGNOSIS — H5203 Hypermetropia, bilateral: Secondary | ICD-10-CM | POA: Diagnosis not present

## 2018-06-11 DIAGNOSIS — G629 Polyneuropathy, unspecified: Secondary | ICD-10-CM | POA: Diagnosis not present

## 2018-06-11 DIAGNOSIS — F325 Major depressive disorder, single episode, in full remission: Secondary | ICD-10-CM | POA: Diagnosis not present

## 2018-06-11 DIAGNOSIS — M1990 Unspecified osteoarthritis, unspecified site: Secondary | ICD-10-CM | POA: Diagnosis not present

## 2018-06-11 DIAGNOSIS — E039 Hypothyroidism, unspecified: Secondary | ICD-10-CM | POA: Diagnosis not present

## 2018-06-11 DIAGNOSIS — J449 Chronic obstructive pulmonary disease, unspecified: Secondary | ICD-10-CM | POA: Diagnosis not present

## 2018-06-11 DIAGNOSIS — Z1331 Encounter for screening for depression: Secondary | ICD-10-CM | POA: Diagnosis not present

## 2018-06-11 DIAGNOSIS — E7800 Pure hypercholesterolemia, unspecified: Secondary | ICD-10-CM | POA: Diagnosis not present

## 2018-06-11 DIAGNOSIS — K5900 Constipation, unspecified: Secondary | ICD-10-CM | POA: Diagnosis not present

## 2018-06-11 DIAGNOSIS — J309 Allergic rhinitis, unspecified: Secondary | ICD-10-CM | POA: Diagnosis not present

## 2018-06-11 DIAGNOSIS — M8580 Other specified disorders of bone density and structure, unspecified site: Secondary | ICD-10-CM | POA: Diagnosis not present

## 2018-08-01 DIAGNOSIS — N898 Other specified noninflammatory disorders of vagina: Secondary | ICD-10-CM | POA: Diagnosis not present

## 2018-08-01 DIAGNOSIS — N8111 Cystocele, midline: Secondary | ICD-10-CM | POA: Diagnosis not present

## 2018-08-14 DIAGNOSIS — Z85828 Personal history of other malignant neoplasm of skin: Secondary | ICD-10-CM | POA: Diagnosis not present

## 2018-08-14 DIAGNOSIS — L98499 Non-pressure chronic ulcer of skin of other sites with unspecified severity: Secondary | ICD-10-CM | POA: Diagnosis not present

## 2018-08-22 DIAGNOSIS — N8111 Cystocele, midline: Secondary | ICD-10-CM | POA: Diagnosis not present

## 2018-08-23 DIAGNOSIS — N8111 Cystocele, midline: Secondary | ICD-10-CM | POA: Diagnosis not present

## 2018-08-29 IMAGING — CT CT ABD-PELV W/ CM
2 of 5 series · 15 of 46 positions shown, 17 images · IV contrast (iopamidol)
Comparison: None.

CLINICAL DATA: [AGE] female with mass left groin region for
the past month. Prior bladder tack, appendectomy and hysterectomy.
Initial encounter.

Creatinine was obtained on site at [HOSPITAL] at [REDACTED].
Results: Creatinine 0.7 mg/dL.
EXAM:
CT ABDOMEN AND PELVIS WITH CONTRAST
TECHNIQUE: Multidetector CT imaging of the abdomen and pelvis was performed
using the standard protocol following bolus administration of
intravenous contrast.
CONTRAST:  100mL 7PDP1W-7JJ IOPAMIDOL ([id number]) INJECTION 61%

[Series 2: abd pelvis 5.00 br40 s3 ax · axial · 0.68mm/px · z∈[+1281,+1646]mm · 12 of 87 slices shown, 14 images]
[im 7/87  soft-tissue]
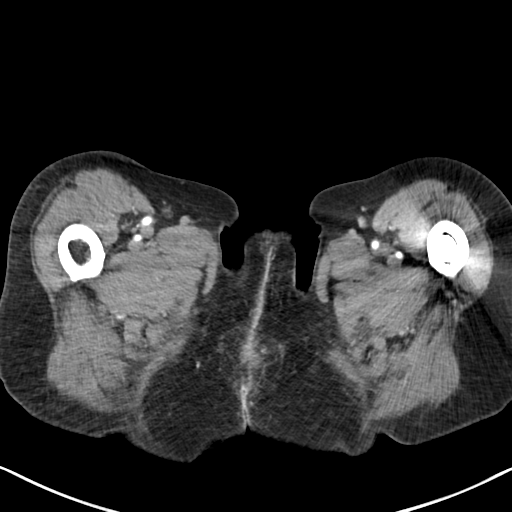
[im 7/87  bone]
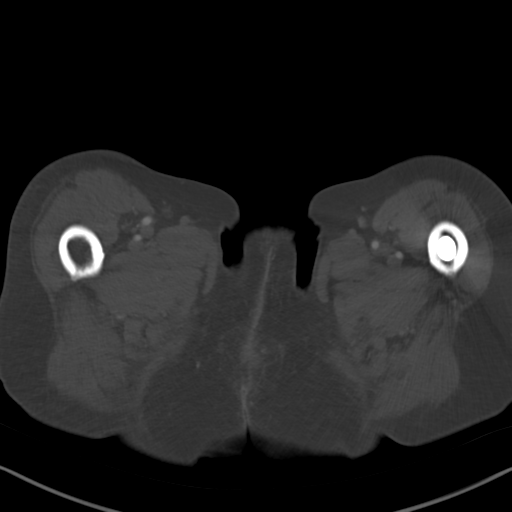
[im 14/87  soft-tissue]
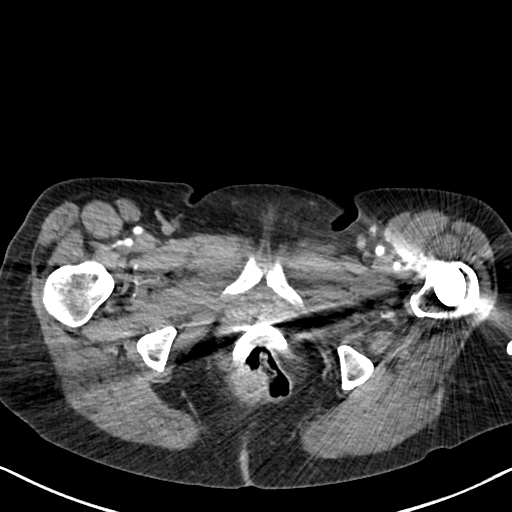
[im 20/87  soft-tissue]
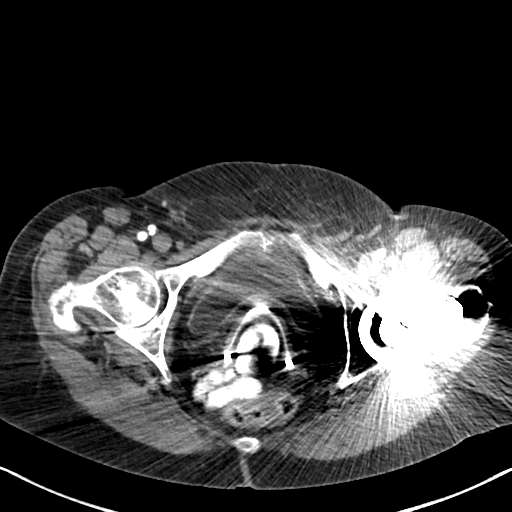
[im 27/87  soft-tissue]
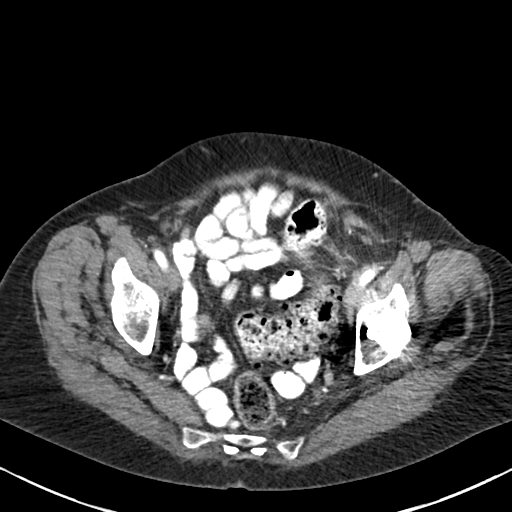
[im 34/87  soft-tissue]
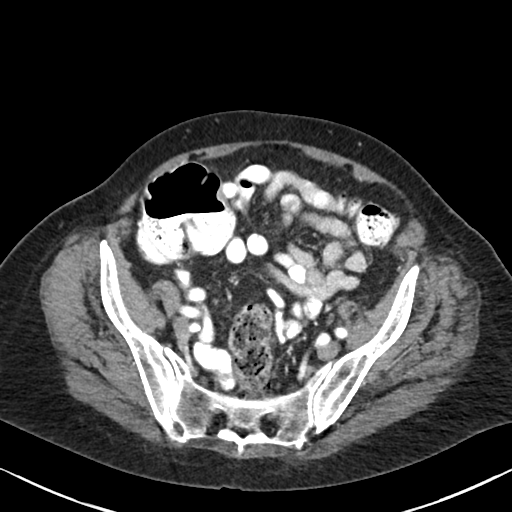
[im 40/87  soft-tissue]
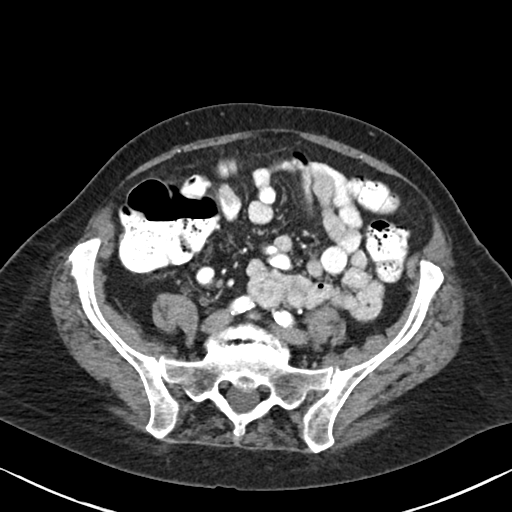
[im 47/87  soft-tissue]
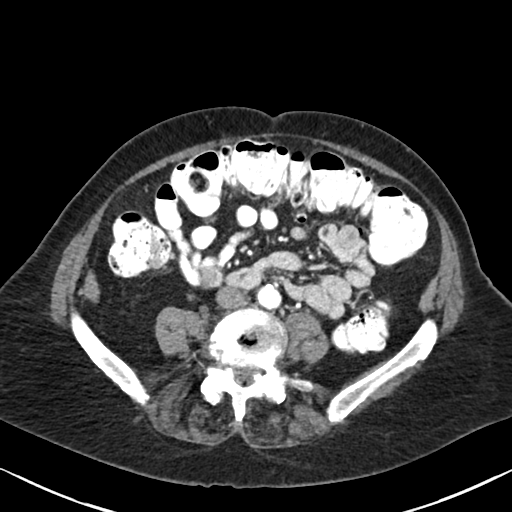
[im 53/87  soft-tissue]
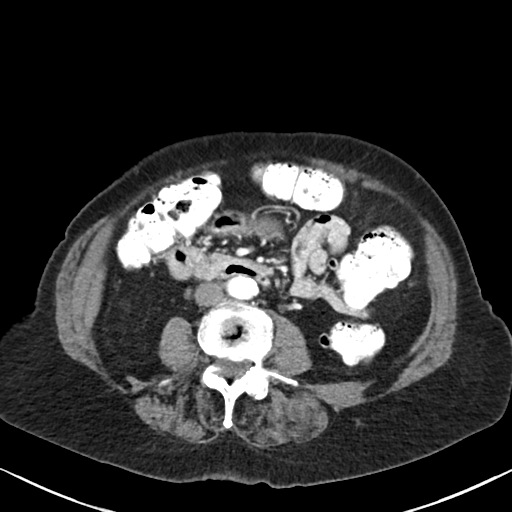
[im 60/87  soft-tissue]
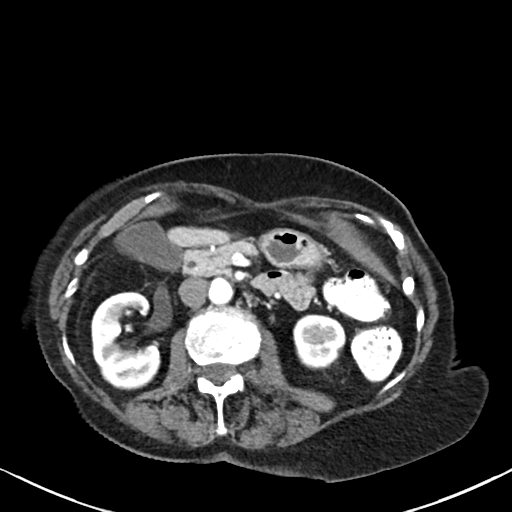
[im 60/87  bone]
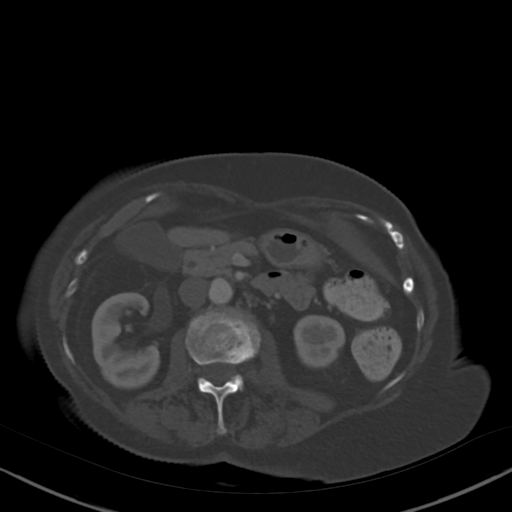
[im 67/87  soft-tissue]
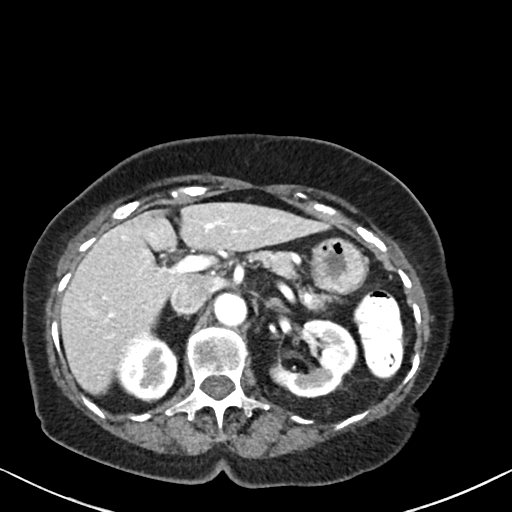
[im 73/87  soft-tissue]
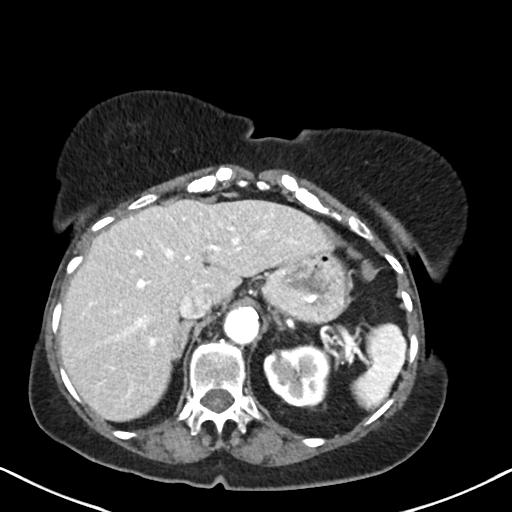
[im 80/87  soft-tissue]
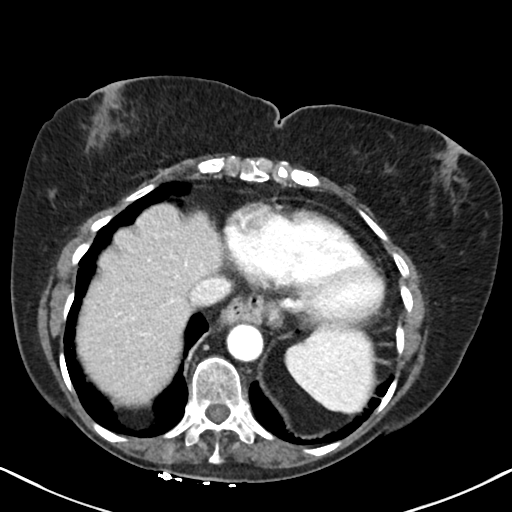

[Series 6: abd pelvis 2.00 br40 s3 cor · coronal · 0.68mm/px · 3 of 140 slices shown]
[im 47/140  soft-tissue]
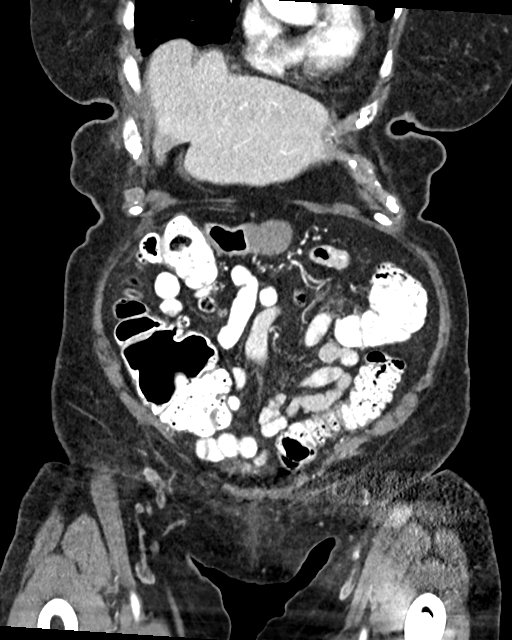
[im 62/140  soft-tissue]
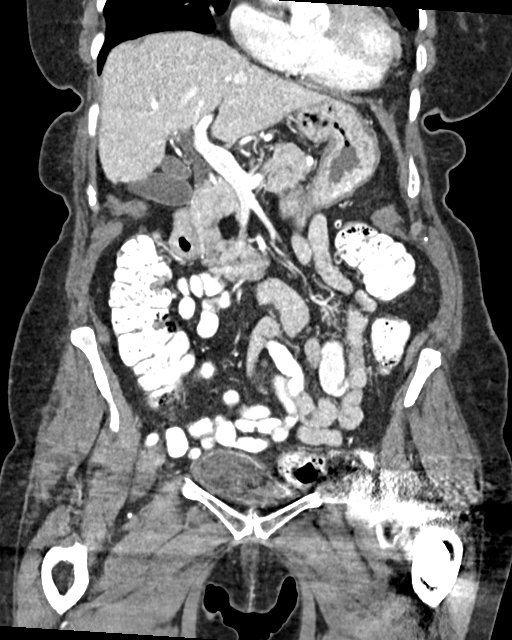
[im 78/140  soft-tissue]
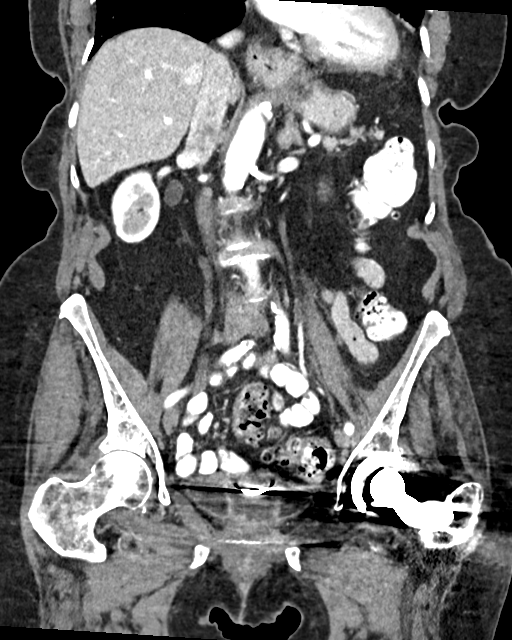

[15 of 46 positions shown; findings below may reference images not displayed]

FINDINGS: Lower chest: Mild chronic lung base changes with minimal
bronchiectasis. Heart size within normal limits.

Hepatobiliary: No worrisome hepatic lesion. No calcified gallstone
or common bile duct stone.

Pancreas: No pancreatic mass or inflammation.

Spleen: No splenic mass or enlargement.

Adrenals/Urinary Tract: No obstructing stone or hydronephrosis. Left
renal cyst. No worrisome renal mass. Mild hyperplasia adrenal
glands. Evaluation of bladder limited by artifact. No gross
abnormality noted.

Stomach/Bowel: Diverticulosis sigmoid colon with prominent
associated muscular hypertrophy. No extraluminal inflammation to
suggest diverticulitis. Small hiatal hernia.

Vascular/Lymphatic: Atherosclerotic changes aorta and aortic branch
vessels without aneurysm or large vessel occlusion. No adenopathy.

Reproductive: Appearance of prior hysterectomy.

Pessary in place.  No worrisome adnexal mass.

Other: No free intraperitoneal air.

Streak artifact from hip replacement limits evaluation however
findings suggestive of small fat and vessel containing left inguinal
hernia. This may be with patient perceives as mass in the left lower
quadrant region.

Musculoskeletal: Degenerative changes lumbar spine L2-3 through
L5-S1. Mild scoliosis convex right. No compression fracture. Prior
left hip replacement. One of the acetabular screws extends through
the cortex. Mild right hip joint degenerative changes.
IMPRESSION: 1. Small fat and vessel containing left inguinal hernia. Question if
this is what may be perceived as left lower quadrant mass.
2. Prominent sigmoid diverticulosis with marked muscular
hypertrophy. No surrounding inflammation to suggest diverticulitis.
3.  Aortic Atherosclerosis ([id number]).

## 2018-10-03 DIAGNOSIS — N8111 Cystocele, midline: Secondary | ICD-10-CM | POA: Diagnosis not present

## 2018-10-03 DIAGNOSIS — R351 Nocturia: Secondary | ICD-10-CM | POA: Diagnosis not present

## 2018-10-17 DIAGNOSIS — R351 Nocturia: Secondary | ICD-10-CM | POA: Diagnosis not present

## 2018-10-17 DIAGNOSIS — N3946 Mixed incontinence: Secondary | ICD-10-CM | POA: Diagnosis not present

## 2018-10-23 DIAGNOSIS — N8111 Cystocele, midline: Secondary | ICD-10-CM | POA: Diagnosis not present

## 2018-10-26 DIAGNOSIS — M79674 Pain in right toe(s): Secondary | ICD-10-CM | POA: Diagnosis not present

## 2018-10-26 DIAGNOSIS — M79675 Pain in left toe(s): Secondary | ICD-10-CM | POA: Diagnosis not present

## 2018-10-26 DIAGNOSIS — B351 Tinea unguium: Secondary | ICD-10-CM | POA: Diagnosis not present

## 2018-10-27 DIAGNOSIS — R42 Dizziness and giddiness: Secondary | ICD-10-CM | POA: Diagnosis not present

## 2018-10-27 DIAGNOSIS — R5381 Other malaise: Secondary | ICD-10-CM | POA: Diagnosis not present

## 2018-10-27 DIAGNOSIS — I10 Essential (primary) hypertension: Secondary | ICD-10-CM | POA: Diagnosis not present

## 2018-11-01 DIAGNOSIS — J309 Allergic rhinitis, unspecified: Secondary | ICD-10-CM | POA: Diagnosis not present

## 2018-11-01 DIAGNOSIS — H8110 Benign paroxysmal vertigo, unspecified ear: Secondary | ICD-10-CM | POA: Diagnosis not present

## 2018-11-13 DIAGNOSIS — H838X3 Other specified diseases of inner ear, bilateral: Secondary | ICD-10-CM | POA: Diagnosis not present

## 2018-11-13 DIAGNOSIS — R42 Dizziness and giddiness: Secondary | ICD-10-CM | POA: Diagnosis not present

## 2018-11-13 DIAGNOSIS — H903 Sensorineural hearing loss, bilateral: Secondary | ICD-10-CM | POA: Diagnosis not present

## 2018-11-21 DIAGNOSIS — H903 Sensorineural hearing loss, bilateral: Secondary | ICD-10-CM | POA: Diagnosis not present

## 2018-11-21 DIAGNOSIS — R42 Dizziness and giddiness: Secondary | ICD-10-CM | POA: Diagnosis not present

## 2018-11-28 DIAGNOSIS — R42 Dizziness and giddiness: Secondary | ICD-10-CM | POA: Diagnosis not present

## 2018-11-28 DIAGNOSIS — R2689 Other abnormalities of gait and mobility: Secondary | ICD-10-CM | POA: Diagnosis not present

## 2018-12-04 DIAGNOSIS — R42 Dizziness and giddiness: Secondary | ICD-10-CM | POA: Diagnosis not present

## 2018-12-04 DIAGNOSIS — R2689 Other abnormalities of gait and mobility: Secondary | ICD-10-CM | POA: Diagnosis not present

## 2018-12-11 DIAGNOSIS — R42 Dizziness and giddiness: Secondary | ICD-10-CM | POA: Diagnosis not present

## 2018-12-11 DIAGNOSIS — R2689 Other abnormalities of gait and mobility: Secondary | ICD-10-CM | POA: Diagnosis not present

## 2018-12-18 DIAGNOSIS — R42 Dizziness and giddiness: Secondary | ICD-10-CM | POA: Diagnosis not present

## 2018-12-18 DIAGNOSIS — R2689 Other abnormalities of gait and mobility: Secondary | ICD-10-CM | POA: Diagnosis not present

## 2018-12-20 DIAGNOSIS — E785 Hyperlipidemia, unspecified: Secondary | ICD-10-CM | POA: Diagnosis not present

## 2018-12-20 DIAGNOSIS — E039 Hypothyroidism, unspecified: Secondary | ICD-10-CM | POA: Diagnosis not present

## 2018-12-20 DIAGNOSIS — Z0000 Encounter for general adult medical examination without abnormal findings: Secondary | ICD-10-CM | POA: Diagnosis not present

## 2018-12-20 DIAGNOSIS — R7989 Other specified abnormal findings of blood chemistry: Secondary | ICD-10-CM | POA: Diagnosis not present

## 2018-12-20 DIAGNOSIS — E559 Vitamin D deficiency, unspecified: Secondary | ICD-10-CM | POA: Diagnosis not present

## 2018-12-20 DIAGNOSIS — M8580 Other specified disorders of bone density and structure, unspecified site: Secondary | ICD-10-CM | POA: Diagnosis not present

## 2018-12-20 DIAGNOSIS — E7800 Pure hypercholesterolemia, unspecified: Secondary | ICD-10-CM | POA: Diagnosis not present

## 2018-12-25 DIAGNOSIS — R42 Dizziness and giddiness: Secondary | ICD-10-CM | POA: Diagnosis not present

## 2018-12-25 DIAGNOSIS — R2689 Other abnormalities of gait and mobility: Secondary | ICD-10-CM | POA: Diagnosis not present

## 2018-12-26 DIAGNOSIS — F325 Major depressive disorder, single episode, in full remission: Secondary | ICD-10-CM | POA: Diagnosis not present

## 2018-12-26 DIAGNOSIS — M1990 Unspecified osteoarthritis, unspecified site: Secondary | ICD-10-CM | POA: Diagnosis not present

## 2018-12-26 DIAGNOSIS — J449 Chronic obstructive pulmonary disease, unspecified: Secondary | ICD-10-CM | POA: Diagnosis not present

## 2018-12-26 DIAGNOSIS — M8580 Other specified disorders of bone density and structure, unspecified site: Secondary | ICD-10-CM | POA: Diagnosis not present

## 2018-12-26 DIAGNOSIS — H9190 Unspecified hearing loss, unspecified ear: Secondary | ICD-10-CM | POA: Diagnosis not present

## 2018-12-26 DIAGNOSIS — J309 Allergic rhinitis, unspecified: Secondary | ICD-10-CM | POA: Diagnosis not present

## 2018-12-26 DIAGNOSIS — Z0000 Encounter for general adult medical examination without abnormal findings: Secondary | ICD-10-CM | POA: Diagnosis not present

## 2018-12-26 DIAGNOSIS — E039 Hypothyroidism, unspecified: Secondary | ICD-10-CM | POA: Diagnosis not present

## 2018-12-26 DIAGNOSIS — N8110 Cystocele, unspecified: Secondary | ICD-10-CM | POA: Diagnosis not present

## 2018-12-26 DIAGNOSIS — Z1339 Encounter for screening examination for other mental health and behavioral disorders: Secondary | ICD-10-CM | POA: Diagnosis not present

## 2018-12-26 DIAGNOSIS — E7800 Pure hypercholesterolemia, unspecified: Secondary | ICD-10-CM | POA: Diagnosis not present

## 2018-12-26 DIAGNOSIS — G629 Polyneuropathy, unspecified: Secondary | ICD-10-CM | POA: Diagnosis not present

## 2018-12-28 DIAGNOSIS — M79675 Pain in left toe(s): Secondary | ICD-10-CM | POA: Diagnosis not present

## 2018-12-28 DIAGNOSIS — M79674 Pain in right toe(s): Secondary | ICD-10-CM | POA: Diagnosis not present

## 2018-12-28 DIAGNOSIS — B351 Tinea unguium: Secondary | ICD-10-CM | POA: Diagnosis not present

## 2019-01-15 DIAGNOSIS — N8111 Cystocele, midline: Secondary | ICD-10-CM | POA: Diagnosis not present

## 2019-02-26 DIAGNOSIS — Z23 Encounter for immunization: Secondary | ICD-10-CM | POA: Diagnosis not present

## 2019-03-26 DIAGNOSIS — Z23 Encounter for immunization: Secondary | ICD-10-CM | POA: Diagnosis not present

## 2019-03-29 DIAGNOSIS — M79674 Pain in right toe(s): Secondary | ICD-10-CM | POA: Diagnosis not present

## 2019-03-29 DIAGNOSIS — M79675 Pain in left toe(s): Secondary | ICD-10-CM | POA: Diagnosis not present

## 2019-03-29 DIAGNOSIS — B351 Tinea unguium: Secondary | ICD-10-CM | POA: Diagnosis not present

## 2019-04-09 DIAGNOSIS — N898 Other specified noninflammatory disorders of vagina: Secondary | ICD-10-CM | POA: Diagnosis not present

## 2019-04-09 DIAGNOSIS — Z4689 Encounter for fitting and adjustment of other specified devices: Secondary | ICD-10-CM | POA: Diagnosis not present

## 2019-04-09 DIAGNOSIS — N8111 Cystocele, midline: Secondary | ICD-10-CM | POA: Diagnosis not present

## 2019-04-30 DIAGNOSIS — N8111 Cystocele, midline: Secondary | ICD-10-CM | POA: Diagnosis not present

## 2019-05-02 DIAGNOSIS — J449 Chronic obstructive pulmonary disease, unspecified: Secondary | ICD-10-CM | POA: Diagnosis not present

## 2019-05-13 DIAGNOSIS — H52203 Unspecified astigmatism, bilateral: Secondary | ICD-10-CM | POA: Diagnosis not present

## 2019-05-13 DIAGNOSIS — Z961 Presence of intraocular lens: Secondary | ICD-10-CM | POA: Diagnosis not present

## 2019-05-13 DIAGNOSIS — H18513 Endothelial corneal dystrophy, bilateral: Secondary | ICD-10-CM | POA: Diagnosis not present

## 2019-05-13 DIAGNOSIS — H5202 Hypermetropia, left eye: Secondary | ICD-10-CM | POA: Diagnosis not present

## 2019-05-31 DIAGNOSIS — M79675 Pain in left toe(s): Secondary | ICD-10-CM | POA: Diagnosis not present

## 2019-05-31 DIAGNOSIS — M79674 Pain in right toe(s): Secondary | ICD-10-CM | POA: Diagnosis not present

## 2019-05-31 DIAGNOSIS — B351 Tinea unguium: Secondary | ICD-10-CM | POA: Diagnosis not present

## 2019-06-24 DIAGNOSIS — R600 Localized edema: Secondary | ICD-10-CM | POA: Diagnosis not present

## 2019-06-24 DIAGNOSIS — S81801A Unspecified open wound, right lower leg, initial encounter: Secondary | ICD-10-CM | POA: Diagnosis not present

## 2019-07-15 DIAGNOSIS — Z85828 Personal history of other malignant neoplasm of skin: Secondary | ICD-10-CM | POA: Diagnosis not present

## 2019-07-15 DIAGNOSIS — D485 Neoplasm of uncertain behavior of skin: Secondary | ICD-10-CM | POA: Diagnosis not present

## 2019-07-15 DIAGNOSIS — L814 Other melanin hyperpigmentation: Secondary | ICD-10-CM | POA: Diagnosis not present

## 2019-07-30 DIAGNOSIS — Z4689 Encounter for fitting and adjustment of other specified devices: Secondary | ICD-10-CM | POA: Diagnosis not present

## 2019-07-30 DIAGNOSIS — N8111 Cystocele, midline: Secondary | ICD-10-CM | POA: Diagnosis not present

## 2019-08-02 DIAGNOSIS — R351 Nocturia: Secondary | ICD-10-CM | POA: Diagnosis not present

## 2019-08-02 DIAGNOSIS — N3946 Mixed incontinence: Secondary | ICD-10-CM | POA: Diagnosis not present

## 2019-08-09 DIAGNOSIS — M79674 Pain in right toe(s): Secondary | ICD-10-CM | POA: Diagnosis not present

## 2019-08-09 DIAGNOSIS — B351 Tinea unguium: Secondary | ICD-10-CM | POA: Diagnosis not present

## 2019-08-09 DIAGNOSIS — M79675 Pain in left toe(s): Secondary | ICD-10-CM | POA: Diagnosis not present

## 2019-08-16 DIAGNOSIS — R350 Frequency of micturition: Secondary | ICD-10-CM | POA: Diagnosis not present

## 2019-10-01 DIAGNOSIS — Z4689 Encounter for fitting and adjustment of other specified devices: Secondary | ICD-10-CM | POA: Diagnosis not present

## 2019-10-01 DIAGNOSIS — N8111 Cystocele, midline: Secondary | ICD-10-CM | POA: Diagnosis not present

## 2019-10-01 DIAGNOSIS — Z90710 Acquired absence of both cervix and uterus: Secondary | ICD-10-CM | POA: Diagnosis not present

## 2019-10-03 DIAGNOSIS — R350 Frequency of micturition: Secondary | ICD-10-CM | POA: Diagnosis not present

## 2019-10-10 DIAGNOSIS — R350 Frequency of micturition: Secondary | ICD-10-CM | POA: Diagnosis not present

## 2019-10-11 DIAGNOSIS — B351 Tinea unguium: Secondary | ICD-10-CM | POA: Diagnosis not present

## 2019-10-11 DIAGNOSIS — M79674 Pain in right toe(s): Secondary | ICD-10-CM | POA: Diagnosis not present

## 2019-10-11 DIAGNOSIS — M79675 Pain in left toe(s): Secondary | ICD-10-CM | POA: Diagnosis not present

## 2019-10-17 DIAGNOSIS — R350 Frequency of micturition: Secondary | ICD-10-CM | POA: Diagnosis not present

## 2019-10-24 DIAGNOSIS — R350 Frequency of micturition: Secondary | ICD-10-CM | POA: Diagnosis not present

## 2019-10-31 DIAGNOSIS — R350 Frequency of micturition: Secondary | ICD-10-CM | POA: Diagnosis not present

## 2019-11-07 DIAGNOSIS — R350 Frequency of micturition: Secondary | ICD-10-CM | POA: Diagnosis not present

## 2019-11-07 DIAGNOSIS — N3946 Mixed incontinence: Secondary | ICD-10-CM | POA: Diagnosis not present

## 2019-11-14 DIAGNOSIS — R350 Frequency of micturition: Secondary | ICD-10-CM | POA: Diagnosis not present

## 2019-11-21 DIAGNOSIS — R350 Frequency of micturition: Secondary | ICD-10-CM | POA: Diagnosis not present

## 2019-11-28 DIAGNOSIS — R350 Frequency of micturition: Secondary | ICD-10-CM | POA: Diagnosis not present

## 2019-12-05 DIAGNOSIS — R350 Frequency of micturition: Secondary | ICD-10-CM | POA: Diagnosis not present

## 2019-12-12 DIAGNOSIS — R350 Frequency of micturition: Secondary | ICD-10-CM | POA: Diagnosis not present

## 2019-12-19 DIAGNOSIS — N3946 Mixed incontinence: Secondary | ICD-10-CM | POA: Diagnosis not present

## 2019-12-24 DIAGNOSIS — Z23 Encounter for immunization: Secondary | ICD-10-CM | POA: Diagnosis not present

## 2019-12-26 DIAGNOSIS — E559 Vitamin D deficiency, unspecified: Secondary | ICD-10-CM | POA: Diagnosis not present

## 2019-12-26 DIAGNOSIS — E039 Hypothyroidism, unspecified: Secondary | ICD-10-CM | POA: Diagnosis not present

## 2019-12-26 DIAGNOSIS — R531 Weakness: Secondary | ICD-10-CM | POA: Diagnosis not present

## 2019-12-26 DIAGNOSIS — Z0000 Encounter for general adult medical examination without abnormal findings: Secondary | ICD-10-CM | POA: Diagnosis not present

## 2019-12-26 DIAGNOSIS — M8580 Other specified disorders of bone density and structure, unspecified site: Secondary | ICD-10-CM | POA: Diagnosis not present

## 2019-12-26 DIAGNOSIS — E7800 Pure hypercholesterolemia, unspecified: Secondary | ICD-10-CM | POA: Diagnosis not present

## 2019-12-31 DIAGNOSIS — Z4689 Encounter for fitting and adjustment of other specified devices: Secondary | ICD-10-CM | POA: Diagnosis not present

## 2019-12-31 DIAGNOSIS — N8111 Cystocele, midline: Secondary | ICD-10-CM | POA: Diagnosis not present

## 2020-01-01 DIAGNOSIS — E871 Hypo-osmolality and hyponatremia: Secondary | ICD-10-CM | POA: Diagnosis not present

## 2020-01-01 DIAGNOSIS — M1990 Unspecified osteoarthritis, unspecified site: Secondary | ICD-10-CM | POA: Diagnosis not present

## 2020-01-01 DIAGNOSIS — H8110 Benign paroxysmal vertigo, unspecified ear: Secondary | ICD-10-CM | POA: Diagnosis not present

## 2020-01-01 DIAGNOSIS — M8580 Other specified disorders of bone density and structure, unspecified site: Secondary | ICD-10-CM | POA: Diagnosis not present

## 2020-01-01 DIAGNOSIS — J449 Chronic obstructive pulmonary disease, unspecified: Secondary | ICD-10-CM | POA: Diagnosis not present

## 2020-01-01 DIAGNOSIS — N8110 Cystocele, unspecified: Secondary | ICD-10-CM | POA: Diagnosis not present

## 2020-01-01 DIAGNOSIS — G629 Polyneuropathy, unspecified: Secondary | ICD-10-CM | POA: Diagnosis not present

## 2020-01-01 DIAGNOSIS — E7800 Pure hypercholesterolemia, unspecified: Secondary | ICD-10-CM | POA: Diagnosis not present

## 2020-01-01 DIAGNOSIS — E039 Hypothyroidism, unspecified: Secondary | ICD-10-CM | POA: Diagnosis not present

## 2020-01-01 DIAGNOSIS — E559 Vitamin D deficiency, unspecified: Secondary | ICD-10-CM | POA: Diagnosis not present

## 2020-01-01 DIAGNOSIS — Z0000 Encounter for general adult medical examination without abnormal findings: Secondary | ICD-10-CM | POA: Diagnosis not present

## 2020-01-01 DIAGNOSIS — F325 Major depressive disorder, single episode, in full remission: Secondary | ICD-10-CM | POA: Diagnosis not present

## 2020-02-03 ENCOUNTER — Ambulatory Visit (INDEPENDENT_AMBULATORY_CARE_PROVIDER_SITE_OTHER): Payer: Medicare Other | Admitting: Obstetrics and Gynecology

## 2020-02-03 ENCOUNTER — Other Ambulatory Visit: Payer: Self-pay

## 2020-02-03 ENCOUNTER — Encounter: Payer: Self-pay | Admitting: Obstetrics and Gynecology

## 2020-02-03 VITALS — BP 120/76 | Ht 60.5 in | Wt 130.0 lb

## 2020-02-03 DIAGNOSIS — N939 Abnormal uterine and vaginal bleeding, unspecified: Secondary | ICD-10-CM

## 2020-02-03 DIAGNOSIS — N898 Other specified noninflammatory disorders of vagina: Secondary | ICD-10-CM

## 2020-02-03 DIAGNOSIS — N8111 Cystocele, midline: Secondary | ICD-10-CM

## 2020-02-03 DIAGNOSIS — Z4689 Encounter for fitting and adjustment of other specified devices: Secondary | ICD-10-CM

## 2020-02-03 LAB — WET PREP FOR TRICH, YEAST, CLUE

## 2020-02-03 NOTE — Progress Notes (Signed)
Carmen Roy 1918/12/09 [id number]  SUBJECTIVE:  86 y.o. [id number] female new patient presents for pessary care.  She says her longtime gynecologist retired she was seeking a Comptroller.  She has had some occasional light vaginal bleeding.  She believes that sometime earlier this year they changed her pessary size but she thinks it is the same type of pessary, but she is not certain.  We do not have any records to review today.  She indicates that she has had a prior hysterectomy.  She believes she has had the pessary in for at least 6 months, she used to go every 3 months to get pessary maintenance.  Current Outpatient Medications  Medication Sig Dispense Refill  . atorvastatin (LIPITOR) 10 MG tablet Take 10 mg by mouth daily.    . calcium carbonate (TUMS - DOSED IN MG ELEMENTAL CALCIUM) 500 MG chewable tablet Chew 1 tablet by mouth daily.    . Calcium Citrate (CAL-CITRATE PO) Take by mouth.    . levothyroxine (SYNTHROID) 25 MCG tablet Take 25 mcg by mouth daily before breakfast.    . LYSINE PO Take by mouth.    . Multiple Vitamin (MULTIVITAMIN) capsule Take 1 capsule by mouth daily.    Marland Kitchen VITAMIN D PO Take by mouth.     No current facility-administered medications for this visit.   Allergies: Latex  No LMP recorded. Patient is postmenopausal.  Past medical history,surgical history, problem list, medications, allergies, family history and social history were all reviewed and documented as reviewed in the EPIC chart.  ROS: Pertinent positives and negatives as reviewed in HPI   OBJECTIVE:  BP 120/76   Ht 5' 0.5" (1.537 m)   Wt 130 lb (59 kg)   BMI 24.97 kg/m  The patient appears well, alert, oriented, in no distress. PELVIC EXAM:  Gehrung pessary removed without difficulty and cleansed and set aside in a bag VULVA: normal appearing vulva with trophic change, no masses, tenderness or lesions, VAGINA: Vaginal erosions on the bilateral areas of the vaginal cuff are noted, hemostatic,  normal appearing vagina with atrophic change, normal color and thick white discharge,  CERVIX: surgically absent, UTERUS: surgically absent, vaginal cuff normal but prolapsed, ADNEXA: normal adnexa in size, nontender and no masses, WET MOUNT done - results: negative for pathogens, normal epithelial cells  With the patient's permission we did try a Gellhorn pessary 2-1/2 inch size to see if that would fit better with more rounded edges and potentially be less bothersome to the vaginal canal and not cause erosions. With placement the patient was comfortable but upon sitting up she noted immediate bladder leakage so we had her return to the dorsal lithotomy position and we removed the pessary without difficulty. We abandoned any further attempt to perform a new pessary fitting.  Chaperone: Caryn Bee present during the examination  ASSESSMENT:  86 y.o. [id number] here for pessary maintenance and evaluation of vaginal bleeding with finding of vaginal erosions  PLAN:  We reviewed the findings on the examination today and I recommended that the patient leave out the pessary for at least 3 to 4 weeks. I want to let the vaginal epithelium heal a little before she reinserts the pessary. The little bit of vaginal bleeding that she has had appears to come from the vaginal erosions. With the prior hysterectomy she does not have a uterus or cervix so I am not worried about that being a potential source of bleeding. The patient was given her original pessary  and told to bring it to her appointment in 3 to 4 weeks to replace the pessary at that time. Vaginal wet mount was negative today. If any acute concerns with bladder or bowel function in the meantime or unreducible organ prolapse she should be evaluated sooner.    Joseph Pierini MD 02/03/20

## 2020-02-06 DIAGNOSIS — N3946 Mixed incontinence: Secondary | ICD-10-CM | POA: Diagnosis not present

## 2020-02-06 DIAGNOSIS — R350 Frequency of micturition: Secondary | ICD-10-CM | POA: Diagnosis not present

## 2020-02-14 DIAGNOSIS — M79674 Pain in right toe(s): Secondary | ICD-10-CM | POA: Diagnosis not present

## 2020-02-14 DIAGNOSIS — B351 Tinea unguium: Secondary | ICD-10-CM | POA: Diagnosis not present

## 2020-02-14 DIAGNOSIS — M79675 Pain in left toe(s): Secondary | ICD-10-CM | POA: Diagnosis not present

## 2020-03-12 ENCOUNTER — Ambulatory Visit (INDEPENDENT_AMBULATORY_CARE_PROVIDER_SITE_OTHER): Payer: Medicare Other | Admitting: Obstetrics and Gynecology

## 2020-03-12 ENCOUNTER — Other Ambulatory Visit: Payer: Self-pay

## 2020-03-12 ENCOUNTER — Encounter: Payer: Self-pay | Admitting: Obstetrics and Gynecology

## 2020-03-12 VITALS — BP 132/80

## 2020-03-12 DIAGNOSIS — Z4689 Encounter for fitting and adjustment of other specified devices: Secondary | ICD-10-CM | POA: Diagnosis not present

## 2020-03-12 NOTE — Progress Notes (Signed)
   Carmen Roy 1918/05/12 [id number]  SUBJECTIVE:  87 y.o. [id number] female presents for reinsertion of her pessary.  She had presented to [place] 02/03/2020 for pessary maintenance as a new patient as her previous gynecologist retired.  Shee was found to have some vaginal erosions on exam and told to leave the pessary out for 1 month so she returns today.  We did try a fitting a Gellhorn pessary which did not suit her well so we decided to plan on having her return to replace the Gehrung pessary at another time.  Since having the pessary removed she has had recurrence of bladder and vaginal prolapse.  She has been able to void fine, she is having little bit of trouble with passing stool sometimes and needing to splint.  Not currently having any pain, vaginal bleeding, nor discharge.   Current Outpatient Medications  Medication Sig Dispense Refill  . atorvastatin (LIPITOR) 10 MG tablet Take 10 mg by mouth daily.    . calcium carbonate (TUMS - DOSED IN MG ELEMENTAL CALCIUM) 500 MG chewable tablet Chew 1 tablet by mouth daily.    . Calcium Citrate (CAL-CITRATE PO) Take by mouth.    . levothyroxine (SYNTHROID) 25 MCG tablet Take 25 mcg by mouth daily before breakfast.    . LYSINE PO Take by mouth.    . Multiple Vitamin (MULTIVITAMIN) capsule Take 1 capsule by mouth daily.    Marland Kitchen VITAMIN D PO Take by mouth.     No current facility-administered medications for this visit.   Allergies: Latex  No LMP recorded. Patient is postmenopausal.  Past medical history,surgical history, problem list, medications, allergies, family history and social history were all reviewed and documented as reviewed in the EPIC chart.   OBJECTIVE:  BP 132/80 (BP Location: Right Arm, Patient Position: Sitting, Cuff Size: Normal)  The patient appears well, alert, oriented, in no distress. PELVIC EXAM: VULVA: normal appearing vulva with no masses, tenderness or lesions, VAGINA: normal appearing vagina with normal color and  discharge, no lesions, erosions have healed  Patient's original Gehrung pessary is inserted according to manufacturer's instructions in the standard fashion (she had kept the device and brought it back today for insertion).  I was able to get a finger between the edges of the pessary and the vaginal wall with ease.  The patient felt well after the insertion and did not have any discomfort or pelvic pressure sensation.  Chaperone: Montez Hageman present during the examination  ASSESSMENT:  87 y.o. [id number] here for reinsertion of Gehrung pessary  PLAN:  Pessary insertion is accomplished today without difficulty.  Vaginal exam reveals healing of erosions that were present previously.  Since she is nervous about having the pessary back in place and recurrence of erosions, I recommend that she return in 1 month for reexamination and then we can space the intervals out after then if all appears well at that time.  She does understand that I am leaving this practice in March 2022 and we can plan for another provider to assume her gynecologic care at the practice here.   Joseph Pierini MD 03/12/20

## 2020-03-17 ENCOUNTER — Telehealth: Payer: Self-pay

## 2020-03-17 NOTE — Telephone Encounter (Signed)
Patient advised.

## 2020-03-17 NOTE — Telephone Encounter (Signed)
Yes that is to be expected with pessary removal and reinsertion as it causes the tissues to have a little bleeding.  As long as it stops within a day or 2 after the exam, then nothing to worry about.

## 2020-03-17 NOTE — Telephone Encounter (Signed)
Patient had pessary re-inserted on Thursday. She had bleeding for several days when she wiped.  It has stopped last two days.  I reassure her that likely from vaginal wall tissues being irritated during insertion and no worries. I told her I would run it by Dr. Delilah Shan and let her know that he agreed.

## 2020-04-07 DIAGNOSIS — S80812A Abrasion, left lower leg, initial encounter: Secondary | ICD-10-CM | POA: Diagnosis not present

## 2020-04-07 DIAGNOSIS — L089 Local infection of the skin and subcutaneous tissue, unspecified: Secondary | ICD-10-CM | POA: Diagnosis not present

## 2020-04-07 DIAGNOSIS — G629 Polyneuropathy, unspecified: Secondary | ICD-10-CM | POA: Diagnosis not present

## 2020-04-13 DIAGNOSIS — S80812A Abrasion, left lower leg, initial encounter: Secondary | ICD-10-CM | POA: Diagnosis not present

## 2020-04-13 DIAGNOSIS — L089 Local infection of the skin and subcutaneous tissue, unspecified: Secondary | ICD-10-CM | POA: Diagnosis not present

## 2020-04-14 ENCOUNTER — Other Ambulatory Visit: Payer: Self-pay

## 2020-04-14 ENCOUNTER — Encounter: Payer: Self-pay | Admitting: Obstetrics and Gynecology

## 2020-04-14 ENCOUNTER — Ambulatory Visit (INDEPENDENT_AMBULATORY_CARE_PROVIDER_SITE_OTHER): Payer: Medicare Other | Admitting: Obstetrics and Gynecology

## 2020-04-14 VITALS — BP 142/70 | HR 76 | Wt 128.0 lb

## 2020-04-14 DIAGNOSIS — Z4689 Encounter for fitting and adjustment of other specified devices: Secondary | ICD-10-CM | POA: Diagnosis not present

## 2020-04-14 NOTE — Progress Notes (Signed)
   Carmen Roy 1918-10-10 [id number]  SUBJECTIVE:  87 y.o. [id number] female presents for a 1 month recheck after her pessary was inserted last month, it was left out for about a month because she had some erosions noted back in December.  We replaced her Gehrung pessary 03/12/2020.  She denies vaginal bleeding.  Bowel and bladder function have been normal.  Not currently having any pain, vaginal bleeding, nor discharge.   Current Outpatient Medications  Medication Sig Dispense Refill  . atorvastatin (LIPITOR) 10 MG tablet Take 10 mg by mouth daily.    . calcium carbonate (TUMS - DOSED IN MG ELEMENTAL CALCIUM) 500 MG chewable tablet Chew 1 tablet by mouth daily.    . Fluticasone-Salmeterol (ADVAIR) 250-50 MCG/DOSE AEPB Inhale 1 puff twice daily.  Rinse and spit with each use.    . levothyroxine (SYNTHROID) 25 MCG tablet Take 25 mcg by mouth daily before breakfast.    . LYSINE PO Take by mouth.    . Multiple Vitamin (MULTIVITAMIN) capsule Take 1 capsule by mouth daily.    . solifenacin (VESICARE) 5 MG tablet Take 5 mg by mouth daily.    Marland Kitchen VITAMIN D PO Take by mouth.    . Calcium Citrate (CAL-CITRATE PO) Take by mouth. (Patient not taking: Reported on 04/14/2020)     No current facility-administered medications for this visit.   Allergies: Latex  No LMP recorded. Patient is postmenopausal.  Past medical history,surgical history, problem list, medications, allergies, family history and social history were all reviewed and documented as reviewed in the EPIC chart.   OBJECTIVE:  BP (!) 142/70 (BP Location: Right Arm, Patient Position: Sitting, Cuff Size: Normal)   Pulse 76   Wt 128 lb (58.1 kg)   BMI 24.59 kg/m  The patient appears well, alert, oriented, in no distress. PELVIC EXAM: VULVA: normal appearing vulva with no masses, tenderness or lesions, VAGINA: normal appearing vagina with normal color and discharge, no lesions, erosions have healed  Patient's original Gehrung pessary is  removed without difficulty.  Exam findings noted as above.  The pessary was then inserted according to manufacturer's instructions in the standard fashion and I was able to get a finger between the edges of the pessary and the vaginal wall with ease.  The patient felt well after the insertion and did not have any discomfort or pelvic pressure sensation.  Chaperone: Terence Lux present during the examination  ASSESSMENT:  87 y.o. [id number] here for reinsertion of Gehrung pessary  PLAN:  Pessary maintenance performed today.  Vaginal exam normal.  She is tolerating her current pessary and doing well with it.  She does understand that I am leaving this practice in March 2022 and we will plan for another partner to assume her gynecologic care at the practice here.  RTC 3 months.   Joseph Pierini MD 04/14/20

## 2020-04-23 DIAGNOSIS — M79675 Pain in left toe(s): Secondary | ICD-10-CM | POA: Diagnosis not present

## 2020-04-23 DIAGNOSIS — B351 Tinea unguium: Secondary | ICD-10-CM | POA: Diagnosis not present

## 2020-04-23 DIAGNOSIS — M79674 Pain in right toe(s): Secondary | ICD-10-CM | POA: Diagnosis not present

## 2020-05-13 DIAGNOSIS — Z85828 Personal history of other malignant neoplasm of skin: Secondary | ICD-10-CM | POA: Diagnosis not present

## 2020-05-13 DIAGNOSIS — S80812S Abrasion, left lower leg, sequela: Secondary | ICD-10-CM | POA: Diagnosis not present

## 2020-06-17 DIAGNOSIS — Z23 Encounter for immunization: Secondary | ICD-10-CM | POA: Diagnosis not present

## 2020-06-23 DIAGNOSIS — B351 Tinea unguium: Secondary | ICD-10-CM | POA: Diagnosis not present

## 2020-06-23 DIAGNOSIS — M79675 Pain in left toe(s): Secondary | ICD-10-CM | POA: Diagnosis not present

## 2020-06-23 DIAGNOSIS — M79674 Pain in right toe(s): Secondary | ICD-10-CM | POA: Diagnosis not present

## 2020-07-02 DIAGNOSIS — E7800 Pure hypercholesterolemia, unspecified: Secondary | ICD-10-CM | POA: Diagnosis not present

## 2020-07-02 DIAGNOSIS — M8580 Other specified disorders of bone density and structure, unspecified site: Secondary | ICD-10-CM | POA: Diagnosis not present

## 2020-07-02 DIAGNOSIS — E039 Hypothyroidism, unspecified: Secondary | ICD-10-CM | POA: Diagnosis not present

## 2020-07-02 DIAGNOSIS — Z1331 Encounter for screening for depression: Secondary | ICD-10-CM | POA: Diagnosis not present

## 2020-07-02 DIAGNOSIS — N8110 Cystocele, unspecified: Secondary | ICD-10-CM | POA: Diagnosis not present

## 2020-07-02 DIAGNOSIS — E559 Vitamin D deficiency, unspecified: Secondary | ICD-10-CM | POA: Diagnosis not present

## 2020-07-02 DIAGNOSIS — M1990 Unspecified osteoarthritis, unspecified site: Secondary | ICD-10-CM | POA: Diagnosis not present

## 2020-07-02 DIAGNOSIS — J449 Chronic obstructive pulmonary disease, unspecified: Secondary | ICD-10-CM | POA: Diagnosis not present

## 2020-07-02 DIAGNOSIS — K5900 Constipation, unspecified: Secondary | ICD-10-CM | POA: Diagnosis not present

## 2020-07-02 DIAGNOSIS — E871 Hypo-osmolality and hyponatremia: Secondary | ICD-10-CM | POA: Diagnosis not present

## 2020-07-02 DIAGNOSIS — G629 Polyneuropathy, unspecified: Secondary | ICD-10-CM | POA: Diagnosis not present

## 2020-07-02 DIAGNOSIS — Z1389 Encounter for screening for other disorder: Secondary | ICD-10-CM | POA: Diagnosis not present

## 2020-07-07 ENCOUNTER — Other Ambulatory Visit: Payer: Self-pay

## 2020-07-07 ENCOUNTER — Encounter: Payer: Self-pay | Admitting: Obstetrics & Gynecology

## 2020-07-07 ENCOUNTER — Ambulatory Visit (INDEPENDENT_AMBULATORY_CARE_PROVIDER_SITE_OTHER): Payer: Medicare Other | Admitting: Obstetrics & Gynecology

## 2020-07-07 VITALS — BP 134/80

## 2020-07-07 DIAGNOSIS — Z4689 Encounter for fitting and adjustment of other specified devices: Secondary | ICD-10-CM

## 2020-07-07 DIAGNOSIS — R32 Unspecified urinary incontinence: Secondary | ICD-10-CM

## 2020-07-07 NOTE — Progress Notes (Signed)
    Carmen Roy 07-Dec-1918 [id number]        87 y.o.  [id number]   RP: Pessary maintenance  HPI: Last visit 03/2020:  Colin Rhein pessary was reinserted 02/2020, after it was left out for about a month because she had some erosions noted back in December. She denies vaginal bleeding. Bowel and bladder functions have been stable.  Using pads for Urinary incontinence.  Not currently having any pain or discharge.   OB History  Gravida Para Term Preterm AB Living  6 5     1 5   SAB IAB Ectopic Multiple Live Births  1            # Outcome Date GA Lbr Len/2nd Weight Sex Delivery Anes PTL Lv  6 Para           5 Para           4 Para           3 Para           2 Para           1 SAB             Past medical history,surgical history, problem list, medications, allergies, family history and social history were all reviewed and documented in the EPIC chart.   Directed ROS with pertinent positives and negatives documented in the history of present illness/assessment and plan.  Exam:  There were no vitals filed for this visit. General appearance:  Normal  Abdomen: Normal  Gynecologic exam: Vulva normal.  Gehrung pessary removed and cleaned.  No vaginal discharge or bleeding.  Vaginal mucosa intact.  Pessary put back in place easily.   Assessment/Plan:  87 y.o. [id number]   1. Encounter for pessary maintenance Well with the Gehrung pessary.  No pessary complication.  Stable Urinary incontinence, using pads.  Vaginal mucosa intact. F/U in 4 months.  Princess Bruins MD, 11:29 AM 07/07/2020

## 2020-08-13 DIAGNOSIS — N3000 Acute cystitis without hematuria: Secondary | ICD-10-CM | POA: Diagnosis not present

## 2020-08-13 DIAGNOSIS — N3946 Mixed incontinence: Secondary | ICD-10-CM | POA: Diagnosis not present

## 2020-08-27 DIAGNOSIS — H524 Presbyopia: Secondary | ICD-10-CM | POA: Diagnosis not present

## 2020-08-27 DIAGNOSIS — Z961 Presence of intraocular lens: Secondary | ICD-10-CM | POA: Diagnosis not present

## 2020-08-27 DIAGNOSIS — H18513 Endothelial corneal dystrophy, bilateral: Secondary | ICD-10-CM | POA: Diagnosis not present

## 2020-09-02 DIAGNOSIS — M79675 Pain in left toe(s): Secondary | ICD-10-CM | POA: Diagnosis not present

## 2020-09-02 DIAGNOSIS — M79674 Pain in right toe(s): Secondary | ICD-10-CM | POA: Diagnosis not present

## 2020-09-02 DIAGNOSIS — B351 Tinea unguium: Secondary | ICD-10-CM | POA: Diagnosis not present

## 2020-09-29 DIAGNOSIS — N3946 Mixed incontinence: Secondary | ICD-10-CM | POA: Diagnosis not present

## 2020-11-10 ENCOUNTER — Ambulatory Visit: Payer: Medicare Other | Admitting: Obstetrics & Gynecology

## 2020-11-11 DIAGNOSIS — Z23 Encounter for immunization: Secondary | ICD-10-CM | POA: Diagnosis not present

## 2020-11-12 ENCOUNTER — Encounter: Payer: Self-pay | Admitting: Obstetrics & Gynecology

## 2020-11-12 ENCOUNTER — Ambulatory Visit (INDEPENDENT_AMBULATORY_CARE_PROVIDER_SITE_OTHER): Payer: Medicare Other | Admitting: Obstetrics & Gynecology

## 2020-11-12 ENCOUNTER — Other Ambulatory Visit: Payer: Self-pay

## 2020-11-12 VITALS — BP 120/78 | HR 60

## 2020-11-12 DIAGNOSIS — Z4689 Encounter for fitting and adjustment of other specified devices: Secondary | ICD-10-CM

## 2020-11-12 NOTE — Progress Notes (Signed)
    Carmen Roy 1918-10-03 [id number]        87 y.o.  [id number]   RP: Pessary maintenance  HPI: Well on Gehrung pessary which was reinserted 02/2020, after it was left out for about a month because she had some erosions noted back in December. Last visit 06/2020 the vaginal mucosa was intact.  She denies vaginal bleeding except for a few days after pessary maintenance visits.  Bowel and bladder functions have been stable.  Using pads for Urinary incontinence.  Not currently having any pain or discharge.   OB History  Gravida Para Term Preterm AB Living  '6 5     1 5  '$ SAB IAB Ectopic Multiple Live Births  1            # Outcome Date GA Lbr Len/2nd Weight Sex Delivery Anes PTL Lv  6 Para           5 Para           4 Para           3 Para           2 Para           1 SAB             Past medical history,surgical history, problem list, medications, allergies, family history and social history were all reviewed and documented in the EPIC chart.   Directed ROS with pertinent positives and negatives documented in the history of present illness/assessment and plan.  Exam:  Vitals:   11/12/20 0945  BP: 120/78  Pulse: 60   General appearance:  Normal  Gynecologic exam: Vulva normal.  Pessary removed.  No abnormal discharge or bleeding.  Pessary cleaned.  Vaginal mucosa intact.  Pessary put back in place.   Assessment/Plan:  87 y.o. [id number]   1. Pessary maintenance  Well with the Gehrung pessary.  No pessary complication.  Stable Urinary incontinence, using pads.  Vaginal mucosa intact. F/U in 6 months.  Princess Bruins MD, 10:12 AM 11/12/2020

## 2020-11-17 DIAGNOSIS — M79675 Pain in left toe(s): Secondary | ICD-10-CM | POA: Diagnosis not present

## 2020-11-17 DIAGNOSIS — B351 Tinea unguium: Secondary | ICD-10-CM | POA: Diagnosis not present

## 2020-11-17 DIAGNOSIS — M79674 Pain in right toe(s): Secondary | ICD-10-CM | POA: Diagnosis not present

## 2020-12-01 ENCOUNTER — Ambulatory Visit: Payer: Medicare Other | Admitting: Obstetrics & Gynecology

## 2020-12-31 DIAGNOSIS — E039 Hypothyroidism, unspecified: Secondary | ICD-10-CM | POA: Diagnosis not present

## 2020-12-31 DIAGNOSIS — E7800 Pure hypercholesterolemia, unspecified: Secondary | ICD-10-CM | POA: Diagnosis not present

## 2020-12-31 DIAGNOSIS — Z0000 Encounter for general adult medical examination without abnormal findings: Secondary | ICD-10-CM | POA: Diagnosis not present

## 2020-12-31 DIAGNOSIS — E559 Vitamin D deficiency, unspecified: Secondary | ICD-10-CM | POA: Diagnosis not present

## 2021-01-14 DIAGNOSIS — N8110 Cystocele, unspecified: Secondary | ICD-10-CM | POA: Diagnosis not present

## 2021-01-14 DIAGNOSIS — M1990 Unspecified osteoarthritis, unspecified site: Secondary | ICD-10-CM | POA: Diagnosis not present

## 2021-01-14 DIAGNOSIS — E559 Vitamin D deficiency, unspecified: Secondary | ICD-10-CM | POA: Diagnosis not present

## 2021-01-14 DIAGNOSIS — J449 Chronic obstructive pulmonary disease, unspecified: Secondary | ICD-10-CM | POA: Diagnosis not present

## 2021-01-14 DIAGNOSIS — M8580 Other specified disorders of bone density and structure, unspecified site: Secondary | ICD-10-CM | POA: Diagnosis not present

## 2021-01-14 DIAGNOSIS — E871 Hypo-osmolality and hyponatremia: Secondary | ICD-10-CM | POA: Diagnosis not present

## 2021-01-14 DIAGNOSIS — G629 Polyneuropathy, unspecified: Secondary | ICD-10-CM | POA: Diagnosis not present

## 2021-01-14 DIAGNOSIS — E7800 Pure hypercholesterolemia, unspecified: Secondary | ICD-10-CM | POA: Diagnosis not present

## 2021-01-14 DIAGNOSIS — E039 Hypothyroidism, unspecified: Secondary | ICD-10-CM | POA: Diagnosis not present

## 2021-01-14 DIAGNOSIS — Z0000 Encounter for general adult medical examination without abnormal findings: Secondary | ICD-10-CM | POA: Diagnosis not present

## 2021-01-14 DIAGNOSIS — I700 Atherosclerosis of aorta: Secondary | ICD-10-CM | POA: Diagnosis not present

## 2021-01-14 DIAGNOSIS — K5900 Constipation, unspecified: Secondary | ICD-10-CM | POA: Diagnosis not present

## 2021-01-28 DIAGNOSIS — B351 Tinea unguium: Secondary | ICD-10-CM | POA: Diagnosis not present

## 2021-01-28 DIAGNOSIS — M79674 Pain in right toe(s): Secondary | ICD-10-CM | POA: Diagnosis not present

## 2021-01-28 DIAGNOSIS — M79675 Pain in left toe(s): Secondary | ICD-10-CM | POA: Diagnosis not present

## 2021-02-08 DIAGNOSIS — Z1331 Encounter for screening for depression: Secondary | ICD-10-CM | POA: Diagnosis not present

## 2021-02-08 DIAGNOSIS — Z1339 Encounter for screening examination for other mental health and behavioral disorders: Secondary | ICD-10-CM | POA: Diagnosis not present

## 2021-03-26 DIAGNOSIS — H903 Sensorineural hearing loss, bilateral: Secondary | ICD-10-CM | POA: Diagnosis not present

## 2021-04-06 DIAGNOSIS — M79675 Pain in left toe(s): Secondary | ICD-10-CM | POA: Diagnosis not present

## 2021-04-06 DIAGNOSIS — M79674 Pain in right toe(s): Secondary | ICD-10-CM | POA: Diagnosis not present

## 2021-04-06 DIAGNOSIS — B351 Tinea unguium: Secondary | ICD-10-CM | POA: Diagnosis not present

## 2021-05-13 ENCOUNTER — Other Ambulatory Visit: Payer: Self-pay

## 2021-05-13 ENCOUNTER — Ambulatory Visit (INDEPENDENT_AMBULATORY_CARE_PROVIDER_SITE_OTHER): Payer: Medicare Other | Admitting: Obstetrics & Gynecology

## 2021-05-13 ENCOUNTER — Encounter: Payer: Self-pay | Admitting: Obstetrics & Gynecology

## 2021-05-13 VITALS — BP 110/64

## 2021-05-13 DIAGNOSIS — Z4689 Encounter for fitting and adjustment of other specified devices: Secondary | ICD-10-CM | POA: Diagnosis not present

## 2021-05-13 NOTE — Progress Notes (Signed)
? ? ?  PERSEPHANIE LAATSCH 1918/07/11 [id number] ? ? ?     88 y.o. [id number]  ?  ?RP: Pessary maintenance ?  ?HPI: Well on Gehrung pessary. Patient denies vaginal bleeding except for a few days after pessary maintenance visits.  Bowel and bladder functions have been stable.  Using pads for Urinary incontinence.  Not currently having any pain or discharge. ?  ? ?OB History  ?Gravida Para Term Preterm AB Living  ?'6 5     1 5  '$ ?SAB IAB Ectopic Multiple Live Births  ?1          ?  ?# Outcome Date GA Lbr Len/2nd Weight Sex Delivery Anes PTL Lv  ?6 Para           ?5 Para           ?4 Para           ?3 Para           ?2 Para           ?1 SAB           ? ? ?Past medical history,surgical history, problem list, medications, allergies, family history and social history were all reviewed and documented in the EPIC chart. ? ? ?Directed ROS with pertinent positives and negatives documented in the history of present illness/assessment and plan. ? ?Exam: ? ?Vitals:  ? 05/13/21 1332  ?BP: 110/64  ? ?General appearance:  Normal ? ?Abdomen: Normal ? ?Gynecologic exam: Vulva normal.  Gehrung pessary removed.  Removal caused mild bleeding from stretching the skin.  No abnormal d/c.  Pessary cleaned.  Vaginal mucosa intact.  Pessary inserted back in the vagina.  Good fit. ? ? ?Assessment/Plan:  88 y.o. [id number]  ? ?1. Pessary maintenance  ?Well with the Gehrung pessary.  No pessary complication.  Stable Urinary incontinence, using pads.  Vaginal mucosa intact. F/U in 6 months. ? ?Princess Bruins MD, 1:40 PM 05/13/2021 ? ? ? ?  ?

## 2021-05-22 ENCOUNTER — Other Ambulatory Visit: Payer: Self-pay

## 2021-05-22 ENCOUNTER — Encounter (HOSPITAL_BASED_OUTPATIENT_CLINIC_OR_DEPARTMENT_OTHER): Payer: Self-pay

## 2021-05-22 ENCOUNTER — Emergency Department (HOSPITAL_BASED_OUTPATIENT_CLINIC_OR_DEPARTMENT_OTHER)
Admission: EM | Admit: 2021-05-22 | Discharge: 2021-05-22 | Disposition: A | Discharge status: Home / Self Care | Payer: Medicare Other | Attending: Emergency Medicine | Admitting: Emergency Medicine

## 2021-05-22 ENCOUNTER — Emergency Department (HOSPITAL_BASED_OUTPATIENT_CLINIC_OR_DEPARTMENT_OTHER): Payer: Medicare Other

## 2021-05-22 DIAGNOSIS — W260XXA Contact with knife, initial encounter: Secondary | ICD-10-CM | POA: Insufficient documentation

## 2021-05-22 DIAGNOSIS — M24542 Contracture, left hand: Secondary | ICD-10-CM | POA: Diagnosis not present

## 2021-05-22 DIAGNOSIS — L089 Local infection of the skin and subcutaneous tissue, unspecified: Secondary | ICD-10-CM | POA: Insufficient documentation

## 2021-05-22 DIAGNOSIS — S61211A Laceration without foreign body of left index finger without damage to nail, initial encounter: Secondary | ICD-10-CM | POA: Diagnosis not present

## 2021-05-22 DIAGNOSIS — L03012 Cellulitis of left finger: Secondary | ICD-10-CM | POA: Diagnosis not present

## 2021-05-22 DIAGNOSIS — M19042 Primary osteoarthritis, left hand: Secondary | ICD-10-CM | POA: Diagnosis not present

## 2021-05-22 MED ORDER — DOXYCYCLINE HYCLATE 100 MG PO CAPS: 100.0000 mg | ORAL_CAPSULE | Freq: Two times a day (BID) | ORAL | 0 refills | Status: AC

## 2021-05-22 MED ORDER — DOXYCYCLINE HYCLATE 100 MG PO TABS
100.0000 mg | ORAL_TABLET | Freq: Once | ORAL | Status: AC
Start: 2021-05-22 — End: 2021-05-22
  Administered 2021-05-22: 100 mg via ORAL
  Filled 2021-05-22: qty 1

## 2021-05-22 NOTE — Discharge Instructions (Addendum)
You were seen in the emergency room today with swollen finger.  I am starting on antibiotics and will have you call the hand surgeon, Dr. Lenon Curt, on Monday to schedule a follow-up appointment for that day.  Please continue your antibiotics.  If you develop worsening swelling, worsening pain, fevers you should return to the emergency department for reevaluation. ?

## 2021-05-22 NOTE — ED Triage Notes (Addendum)
Cut index finger last Sunday. States yesterday, finger contracted & swollen. Unable to straighten joint. Last tetanus unknown. ?

## 2021-05-22 NOTE — ED Provider Notes (Signed)
? ?Emergency Department Provider Note ? ? ?I have reviewed the triage vital signs and the nursing notes. ? ? ?HISTORY ? ?Chief Complaint ?Finger Injury ? ? ?HPI ?Carmen Roy is a 88 y.o. female with past history reviewed below presents emergency department with left index finger injury.  Patient states around 1 week ago she was opening a bottle wine.  She was cutting the foil and accidentally cut herself with a knife.  She cleaned the wound with soap and water and then applied Neosporin along with a Band-Aid.  She states over the past week the finger has become swollen and now flexed. No drainage from the wound or fever.  ? ?Past Medical History:  ?Diagnosis Date  ? Hypercholesteremia   ? Thyroid disease   ? Urinary urgency   ? Currently on experimental study with trigger point and vibration for 12 weeks, 04/17/16  ? Vertigo   ? ? ?Review of Systems ? ?Constitutional: No fever/chills ?Cardiovascular: Denies chest pain. ?Respiratory: Denies shortness of breath. ?Gastrointestinal: No abdominal pain.  ?Musculoskeletal: Left finger pain.  ?Skin: Left index finger swelling/redness.  ? ? ?____________________________________________ ? ? ?PHYSICAL EXAM: ? ?VITAL SIGNS: ?ED Triage Vitals  ?Enc Vitals Group  ?   BP 05/22/21 1117 (!) 156/76  ?   Pulse Rate 05/22/21 1117 69  ?   Resp 05/22/21 1117 18  ?   Temp 05/22/21 1117 98.1 ?F (36.7 ?C)  ?   Temp Source 05/22/21 1117 Oral  ?   SpO2 05/22/21 1117 95 %  ?   Weight 05/22/21 1114 115 lb (52.2 kg)  ?   Height 05/22/21 1114 5' (1.524 m)  ? ?Constitutional: Alert and oriented. Well appearing and in no acute distress. ?Eyes: Conjunctivae are normal.  ?Head: Atraumatic. ?Nose: No congestion/rhinnorhea. ?Mouth/Throat: Mucous membranes are moist. ?Neck: No stridor.  ?Cardiovascular: Normal rate, regular rhythm. Good peripheral circulation. Grossly normal heart sounds.   ?Respiratory: Normal respiratory effort.  No retractions. Lungs CTAB. ?Gastrointestinal: Soft and nontender.  No distention.  ?Musculoskeletal: Left index finger is flexed.  There is some erythema over the PIP joint and extending more distally.  There is no flexor compartment redness or tenderness in the finger, hand, forearm.  ?Neurologic:  Normal speech and language.  ?Skin:  Skin is warm and dry. Left index finger redness.  ? ?____________________________________________ ? ?RADIOLOGY ? ?DG Finger Index Left ? ?Result Date: 05/22/2021 ?CLINICAL DATA:  One 88 year old female with history of laceration to the left index finger. EXAM: LEFT INDEX FINGER 2+V COMPARISON:  No priors. FINDINGS: The left second finger is contracted at the PIP joint. No radiopaque foreign body in the soft tissues. No acute displaced fracture or dislocation. Multifocal degenerative changes of osteoarthritis are noted in the visualized portions of the hands, most severe in the D IP joints. IMPRESSION: 1. Contracture of the left second finger at the PIP joint. 2. No acute osseous abnormality. 3. Osteoarthritis. Electronically Signed   By: Vinnie Langton M.D.   On: 05/22/2021 11:55   ? ?____________________________________________ ? ? ?PROCEDURES ? ?Procedure(s) performed:  ? ?Procedures ? ?None  ?____________________________________________ ? ? ?INITIAL IMPRESSION / ASSESSMENT AND PLAN / ED COURSE ? ?Pertinent labs & imaging results that were available during my care of the patient were reviewed by me and considered in my medical decision making (see chart for details). ?  ?This patient is Presenting for Evaluation of finger injury, which does require a range of treatment options, and is a complaint that  involves a high risk of morbidity and mortality. ? ?The Differential Diagnoses finger cellulitis, wound infection, tenosynovitis, septic joint. ? ?Critical Interventions-  ?  ?Medications  ?doxycycline (VIBRA-TABS) tablet 100 mg (100 mg Oral Given 05/22/21 1215)  ? ? ? ?I did obtain Additional Historical Information from son at  bedside. ? ?Radiologic Tests Ordered, included plain film of left index finger. I independently interpreted the images and agree with radiology interpretation.  ? ?Consult: Spoke with Dr. Lenon Curt, on call for hand surgery. Discussed case and imaging. Plan for abx and patient can call for follow up with his office for follow up on Monday.  ? ?Medical Decision Making: Summary:  ?Patient presents emergency department with left index finger pain and swelling.  There is some flexion of the finger and pain with passive extension.  There is no tenderness over the flexor compartments.  Lan for plain films and will discuss with hand surgery. ? ?Reevaluation with update and discussion with patient and son. Plan for abx, first dose given here, and follow up with hand surgery as above.  ? ?Disposition: discharge ? ?____________________________________________ ? ?FINAL CLINICAL IMPRESSION(S) / ED DIAGNOSES ? ?Final diagnoses:  ?Finger infection  ? ? ? ?NEW OUTPATIENT MEDICATIONS STARTED DURING THIS VISIT: ? ?Discharge Medication List as of 05/22/2021 12:06 PM  ?  ? ?START taking these medications  ? Details  ?doxycycline (VIBRAMYCIN) 100 MG capsule Take 1 capsule (100 mg total) by mouth 2 (two) times daily for 7 days., Starting Sat 05/22/2021, Until Sat 05/29/2021, Normal  ?  ?  ? ? ?Note:  This document was prepared using Dragon voice recognition software and may include unintentional dictation errors. ? ?Nanda Quinton, MD, FACEP ?Emergency Medicine ? ?  ?Margette Fast, MD ?05/22/21 1419 ? ?

## 2021-05-26 DIAGNOSIS — S66321A Laceration of extensor muscle, fascia and tendon of left index finger at wrist and hand level, initial encounter: Secondary | ICD-10-CM | POA: Diagnosis not present

## 2021-06-03 DIAGNOSIS — S66321A Laceration of extensor muscle, fascia and tendon of left index finger at wrist and hand level, initial encounter: Secondary | ICD-10-CM | POA: Diagnosis not present

## 2021-06-08 DIAGNOSIS — B351 Tinea unguium: Secondary | ICD-10-CM | POA: Diagnosis not present

## 2021-06-08 DIAGNOSIS — M79674 Pain in right toe(s): Secondary | ICD-10-CM | POA: Diagnosis not present

## 2021-06-08 DIAGNOSIS — M79675 Pain in left toe(s): Secondary | ICD-10-CM | POA: Diagnosis not present

## 2021-07-20 DIAGNOSIS — Z1339 Encounter for screening examination for other mental health and behavioral disorders: Secondary | ICD-10-CM | POA: Diagnosis not present

## 2021-07-20 DIAGNOSIS — H8110 Benign paroxysmal vertigo, unspecified ear: Secondary | ICD-10-CM | POA: Diagnosis not present

## 2021-07-20 DIAGNOSIS — Z1331 Encounter for screening for depression: Secondary | ICD-10-CM | POA: Diagnosis not present

## 2021-07-20 DIAGNOSIS — E7800 Pure hypercholesterolemia, unspecified: Secondary | ICD-10-CM | POA: Diagnosis not present

## 2021-07-20 DIAGNOSIS — J449 Chronic obstructive pulmonary disease, unspecified: Secondary | ICD-10-CM | POA: Diagnosis not present

## 2021-07-20 DIAGNOSIS — M8580 Other specified disorders of bone density and structure, unspecified site: Secondary | ICD-10-CM | POA: Diagnosis not present

## 2021-07-20 DIAGNOSIS — G629 Polyneuropathy, unspecified: Secondary | ICD-10-CM | POA: Diagnosis not present

## 2021-07-20 DIAGNOSIS — E871 Hypo-osmolality and hyponatremia: Secondary | ICD-10-CM | POA: Diagnosis not present

## 2021-07-20 DIAGNOSIS — E559 Vitamin D deficiency, unspecified: Secondary | ICD-10-CM | POA: Diagnosis not present

## 2021-07-20 DIAGNOSIS — N8110 Cystocele, unspecified: Secondary | ICD-10-CM | POA: Diagnosis not present

## 2021-07-20 DIAGNOSIS — I700 Atherosclerosis of aorta: Secondary | ICD-10-CM | POA: Diagnosis not present

## 2021-07-20 DIAGNOSIS — E039 Hypothyroidism, unspecified: Secondary | ICD-10-CM | POA: Diagnosis not present

## 2021-07-21 DIAGNOSIS — Z23 Encounter for immunization: Secondary | ICD-10-CM | POA: Diagnosis not present

## 2021-08-17 DIAGNOSIS — B351 Tinea unguium: Secondary | ICD-10-CM | POA: Diagnosis not present

## 2021-08-17 DIAGNOSIS — M79675 Pain in left toe(s): Secondary | ICD-10-CM | POA: Diagnosis not present

## 2021-08-17 DIAGNOSIS — M79674 Pain in right toe(s): Secondary | ICD-10-CM | POA: Diagnosis not present

## 2021-09-02 DIAGNOSIS — H18513 Endothelial corneal dystrophy, bilateral: Secondary | ICD-10-CM | POA: Diagnosis not present

## 2021-09-02 DIAGNOSIS — Z961 Presence of intraocular lens: Secondary | ICD-10-CM | POA: Diagnosis not present

## 2021-09-02 DIAGNOSIS — H5203 Hypermetropia, bilateral: Secondary | ICD-10-CM | POA: Diagnosis not present

## 2021-10-01 DIAGNOSIS — N3946 Mixed incontinence: Secondary | ICD-10-CM | POA: Diagnosis not present

## 2021-10-01 DIAGNOSIS — R350 Frequency of micturition: Secondary | ICD-10-CM | POA: Diagnosis not present

## 2021-10-26 DIAGNOSIS — M79674 Pain in right toe(s): Secondary | ICD-10-CM | POA: Diagnosis not present

## 2021-10-26 DIAGNOSIS — M79675 Pain in left toe(s): Secondary | ICD-10-CM | POA: Diagnosis not present

## 2021-10-26 DIAGNOSIS — B351 Tinea unguium: Secondary | ICD-10-CM | POA: Diagnosis not present

## 2021-11-16 ENCOUNTER — Ambulatory Visit (INDEPENDENT_AMBULATORY_CARE_PROVIDER_SITE_OTHER): Payer: Medicare Other | Admitting: Obstetrics & Gynecology

## 2021-11-16 ENCOUNTER — Encounter: Payer: Self-pay | Admitting: Obstetrics & Gynecology

## 2021-11-16 VITALS — BP 108/68 | HR 73

## 2021-11-16 DIAGNOSIS — Z4689 Encounter for fitting and adjustment of other specified devices: Secondary | ICD-10-CM

## 2021-11-16 NOTE — Progress Notes (Signed)
    Carmen Roy 08-05-1918 [id number]        88 y.o.  [id number]    RP: Pessary maintenance   HPI: Well on Gehrung pessary. Patient denies vaginal bleeding except for a few days after pessary maintenance visits.  Bowel and bladder functions have been stable.  Using pads for very mild Urinary incontinence.  Not currently having any pain or discharge.   OB History  Gravida Para Term Preterm AB Living  '6 5     1 5  '$ SAB IAB Ectopic Multiple Live Births  1            # Outcome Date GA Lbr Len/2nd Weight Sex Delivery Anes PTL Lv  6 Para           5 Para           4 Para           3 Para           2 Para           1 SAB             Past medical history,surgical history, problem list, medications, allergies, family history and social history were all reviewed and documented in the EPIC chart.   Directed ROS with pertinent positives and negatives documented in the history of present illness/assessment and plan.  Exam:  Vitals:   11/16/21 1345  BP: 108/68  Pulse: 73  SpO2: 97%   General appearance:  Normal  Gynecologic exam: Vulva normal.  Gehrung pessary removed.  Removal caused mild bleeding from stretching the skin.  No abnormal d/c.  Pessary cleaned.  Vaginal mucosa intact.  Pessary inserted back in the vagina, difficult to place it appropriately, decision therefore to remove it.  Patient examined in standing position with Valsalva: No prolapse, at most grade 1-2/4 Cystocele.  Decision to try without pessary.     Assessment/Plan:  88 y.o. [id number]    1. Pessary maintenance  Gehrung pessary associated with very unpleasant removal/reinsertion and bleeding d/t stretching of the skin. Gehrung difficult to place appropriately in the vagina, decision therefore to remove it.  Patient examined in standing position with Valsalva: No prolapse, at most grade 1-2/4 Cystocele.  Decision to try without pessary.  F/U in 3 weeks if uncomfortable prolapse is present.  In that case, we would  do pessary fitting with a Milex ring instead.  Patient voiced understanding and agreement with the plan.  Princess Bruins MD, 2:10 PM 11/16/2021

## 2021-12-08 ENCOUNTER — Ambulatory Visit: Payer: Medicare Other | Admitting: Obstetrics & Gynecology

## 2021-12-08 DIAGNOSIS — Z23 Encounter for immunization: Secondary | ICD-10-CM | POA: Diagnosis not present

## 2021-12-24 ENCOUNTER — Telehealth: Payer: Self-pay | Admitting: *Deleted

## 2021-12-24 NOTE — Telephone Encounter (Signed)
Patient scheduled on 12/29/21

## 2021-12-24 NOTE — Telephone Encounter (Signed)
Patient was transferred from appointment desk to triage, patient is requesting a office visit with Dr.Lavoie due to prolapse. Patient had pessary removal on 11/16/21 done well until about 2 days ago and now reports "its falling out again" patient is able to urinate without any problems, she is requesting a office visit. Patient was scheduled on 12/08/21 for follow up however she cancelled because the prolapse was better. I sent a message to appointments to schedule patient a visit.

## 2021-12-29 ENCOUNTER — Encounter: Payer: Self-pay | Admitting: Obstetrics & Gynecology

## 2021-12-29 ENCOUNTER — Ambulatory Visit (INDEPENDENT_AMBULATORY_CARE_PROVIDER_SITE_OTHER): Payer: Medicare Other | Admitting: Obstetrics & Gynecology

## 2021-12-29 VITALS — BP 110/64 | HR 78

## 2021-12-29 DIAGNOSIS — N8110 Cystocele, unspecified: Secondary | ICD-10-CM

## 2021-12-29 NOTE — Progress Notes (Signed)
    BEAUTY PLESS [birth year] [id number]        88 y.o.  [id number] Accompanied by her daughter  RP: Bulge vaginally while pushing for BM about 1 week ago  HPI: Has done very well x removal of the Gehrung pessary on 11/16/21.  No bulging, no vaginal pain, no PMB, no vaginal discharge and passing urine well.  Minimal SUI unchanged.  No UTI Sx.  Tendency for constipation controled with a stool softener.  Patient forgot to take her stool softener x a few days and then had to push hard to pass her BM which caused a sensation of bulging vaginally at that time.  That resolved and is having no bulging or pain vaginally currently.  Restarted her stool softener.   OB History  Gravida Para Term Preterm AB Living  '6 5     1 5  '$ SAB IAB Ectopic Multiple Live Births  1            # Outcome Date GA Lbr Len/2nd Weight Sex Delivery Anes PTL Lv  6 Para           5 Para           4 Para           3 Para           2 Para           1 SAB             Past medical history,surgical history, problem list, medications, allergies, family history and social history were all reviewed and documented in the EPIC chart.   Directed ROS with pertinent positives and negatives documented in the history of present illness/assessment and plan.  Exam:  Vitals:   12/29/21 1115  BP: 110/64  Pulse: 78  SpO2: 97%   General appearance:  Normal  Gynecologic exam: Vulva normal.  Bimanual exam with Valsalva:  No uterine prolapse, no rectocele. At most grade 1-2/4 Cystocele.     Assessment/Plan:  88 y.o. [id number]   1. Baden-Walker grade 2 cystocele  Has done very well x removal of the Gehrung pessary on 11/16/21.  No bulging, no vaginal pain, no PMB, no vaginal discharge and passing urine well.  Minimal SUI unchanged.  No UTI Sx.  Tendency for constipation controled with a stool softener.  Patient forgot to take her stool softener x a few days and then had to push hard to pass her BM which caused a sensation of bulging  vaginally at that time.  That resolved and is having no bulging or pain vaginally currently.  Restarted her stool softener.  Patient and daughter reassured about the stable Cystocele grade 1-2 on exam today.  Counseling on management of constipation done.  Reassured that no pessary or surgery is indicated at this time.  Avoidance of pelvic floor pressure, emptying the bladder regularly to avoid overfilling it were recommended.    Princess Bruins MD, 11:48 AM 12/29/2021

## 2022-01-04 DIAGNOSIS — B351 Tinea unguium: Secondary | ICD-10-CM | POA: Diagnosis not present

## 2022-01-04 DIAGNOSIS — M79674 Pain in right toe(s): Secondary | ICD-10-CM | POA: Diagnosis not present

## 2022-01-04 DIAGNOSIS — M79675 Pain in left toe(s): Secondary | ICD-10-CM | POA: Diagnosis not present

## 2022-02-03 DIAGNOSIS — E039 Hypothyroidism, unspecified: Secondary | ICD-10-CM | POA: Diagnosis not present

## 2022-02-03 DIAGNOSIS — E785 Hyperlipidemia, unspecified: Secondary | ICD-10-CM | POA: Diagnosis not present

## 2022-02-03 DIAGNOSIS — N390 Urinary tract infection, site not specified: Secondary | ICD-10-CM | POA: Diagnosis not present

## 2022-02-03 DIAGNOSIS — Z0000 Encounter for general adult medical examination without abnormal findings: Secondary | ICD-10-CM | POA: Diagnosis not present

## 2022-02-03 DIAGNOSIS — E559 Vitamin D deficiency, unspecified: Secondary | ICD-10-CM | POA: Diagnosis not present

## 2022-02-08 DIAGNOSIS — H9190 Unspecified hearing loss, unspecified ear: Secondary | ICD-10-CM | POA: Diagnosis not present

## 2022-02-08 DIAGNOSIS — Z0000 Encounter for general adult medical examination without abnormal findings: Secondary | ICD-10-CM | POA: Diagnosis not present

## 2022-02-08 DIAGNOSIS — I700 Atherosclerosis of aorta: Secondary | ICD-10-CM | POA: Diagnosis not present

## 2022-02-08 DIAGNOSIS — H8110 Benign paroxysmal vertigo, unspecified ear: Secondary | ICD-10-CM | POA: Diagnosis not present

## 2022-02-08 DIAGNOSIS — M8580 Other specified disorders of bone density and structure, unspecified site: Secondary | ICD-10-CM | POA: Diagnosis not present

## 2022-02-08 DIAGNOSIS — N8110 Cystocele, unspecified: Secondary | ICD-10-CM | POA: Diagnosis not present

## 2022-02-08 DIAGNOSIS — Z1339 Encounter for screening examination for other mental health and behavioral disorders: Secondary | ICD-10-CM | POA: Diagnosis not present

## 2022-02-08 DIAGNOSIS — J449 Chronic obstructive pulmonary disease, unspecified: Secondary | ICD-10-CM | POA: Diagnosis not present

## 2022-02-08 DIAGNOSIS — J309 Allergic rhinitis, unspecified: Secondary | ICD-10-CM | POA: Diagnosis not present

## 2022-02-08 DIAGNOSIS — Z1331 Encounter for screening for depression: Secondary | ICD-10-CM | POA: Diagnosis not present

## 2022-02-08 DIAGNOSIS — G629 Polyneuropathy, unspecified: Secondary | ICD-10-CM | POA: Diagnosis not present

## 2022-02-08 DIAGNOSIS — M1990 Unspecified osteoarthritis, unspecified site: Secondary | ICD-10-CM | POA: Diagnosis not present

## 2022-03-21 DIAGNOSIS — M79674 Pain in right toe(s): Secondary | ICD-10-CM | POA: Diagnosis not present

## 2022-03-21 DIAGNOSIS — M79675 Pain in left toe(s): Secondary | ICD-10-CM | POA: Diagnosis not present

## 2022-03-21 DIAGNOSIS — B351 Tinea unguium: Secondary | ICD-10-CM | POA: Diagnosis not present

## 2022-05-05 DIAGNOSIS — R21 Rash and other nonspecific skin eruption: Secondary | ICD-10-CM | POA: Diagnosis not present

## 2022-05-05 DIAGNOSIS — Z85828 Personal history of other malignant neoplasm of skin: Secondary | ICD-10-CM | POA: Diagnosis not present

## 2022-05-05 DIAGNOSIS — L0889 Other specified local infections of the skin and subcutaneous tissue: Secondary | ICD-10-CM | POA: Diagnosis not present

## 2022-05-05 DIAGNOSIS — L98499 Non-pressure chronic ulcer of skin of other sites with unspecified severity: Secondary | ICD-10-CM | POA: Diagnosis not present

## 2022-05-09 DIAGNOSIS — L03115 Cellulitis of right lower limb: Secondary | ICD-10-CM | POA: Diagnosis not present

## 2022-05-09 DIAGNOSIS — Z85828 Personal history of other malignant neoplasm of skin: Secondary | ICD-10-CM | POA: Diagnosis not present

## 2022-05-30 DIAGNOSIS — I83212 Varicose veins of right lower extremity with both ulcer of calf and inflammation: Secondary | ICD-10-CM | POA: Diagnosis not present

## 2022-05-30 DIAGNOSIS — Z85828 Personal history of other malignant neoplasm of skin: Secondary | ICD-10-CM | POA: Diagnosis not present

## 2022-05-30 DIAGNOSIS — I8312 Varicose veins of left lower extremity with inflammation: Secondary | ICD-10-CM | POA: Diagnosis not present

## 2022-05-30 DIAGNOSIS — I8311 Varicose veins of right lower extremity with inflammation: Secondary | ICD-10-CM | POA: Diagnosis not present

## 2022-05-30 DIAGNOSIS — I872 Venous insufficiency (chronic) (peripheral): Secondary | ICD-10-CM | POA: Diagnosis not present

## 2022-06-09 DIAGNOSIS — Z85828 Personal history of other malignant neoplasm of skin: Secondary | ICD-10-CM | POA: Diagnosis not present

## 2022-06-09 DIAGNOSIS — L98499 Non-pressure chronic ulcer of skin of other sites with unspecified severity: Secondary | ICD-10-CM | POA: Diagnosis not present

## 2022-07-11 DIAGNOSIS — I83212 Varicose veins of right lower extremity with both ulcer of calf and inflammation: Secondary | ICD-10-CM | POA: Diagnosis not present

## 2022-07-11 DIAGNOSIS — Z85828 Personal history of other malignant neoplasm of skin: Secondary | ICD-10-CM | POA: Diagnosis not present

## 2022-08-16 DIAGNOSIS — N8110 Cystocele, unspecified: Secondary | ICD-10-CM | POA: Diagnosis not present

## 2022-08-16 DIAGNOSIS — F325 Major depressive disorder, single episode, in full remission: Secondary | ICD-10-CM | POA: Diagnosis not present

## 2022-08-16 DIAGNOSIS — E871 Hypo-osmolality and hyponatremia: Secondary | ICD-10-CM | POA: Diagnosis not present

## 2022-08-16 DIAGNOSIS — E7800 Pure hypercholesterolemia, unspecified: Secondary | ICD-10-CM | POA: Diagnosis not present

## 2022-08-16 DIAGNOSIS — I700 Atherosclerosis of aorta: Secondary | ICD-10-CM | POA: Diagnosis not present

## 2022-08-16 DIAGNOSIS — H8110 Benign paroxysmal vertigo, unspecified ear: Secondary | ICD-10-CM | POA: Diagnosis not present

## 2022-08-16 DIAGNOSIS — M1990 Unspecified osteoarthritis, unspecified site: Secondary | ICD-10-CM | POA: Diagnosis not present

## 2022-08-16 DIAGNOSIS — J449 Chronic obstructive pulmonary disease, unspecified: Secondary | ICD-10-CM | POA: Diagnosis not present

## 2022-08-16 DIAGNOSIS — J309 Allergic rhinitis, unspecified: Secondary | ICD-10-CM | POA: Diagnosis not present

## 2022-08-16 DIAGNOSIS — G629 Polyneuropathy, unspecified: Secondary | ICD-10-CM | POA: Diagnosis not present

## 2022-08-16 DIAGNOSIS — L03116 Cellulitis of left lower limb: Secondary | ICD-10-CM | POA: Diagnosis not present

## 2022-08-16 DIAGNOSIS — E039 Hypothyroidism, unspecified: Secondary | ICD-10-CM | POA: Diagnosis not present

## 2022-10-13 DIAGNOSIS — Z961 Presence of intraocular lens: Secondary | ICD-10-CM | POA: Diagnosis not present

## 2022-10-13 DIAGNOSIS — H18513 Endothelial corneal dystrophy, bilateral: Secondary | ICD-10-CM | POA: Diagnosis not present

## 2022-10-18 DIAGNOSIS — Z789 Other specified health status: Secondary | ICD-10-CM | POA: Diagnosis not present

## 2022-10-18 DIAGNOSIS — L602 Onychogryphosis: Secondary | ICD-10-CM | POA: Diagnosis not present

## 2022-10-18 DIAGNOSIS — Z7409 Other reduced mobility: Secondary | ICD-10-CM | POA: Diagnosis not present

## 2022-10-18 DIAGNOSIS — L603 Nail dystrophy: Secondary | ICD-10-CM | POA: Diagnosis not present

## 2022-12-01 DIAGNOSIS — Z23 Encounter for immunization: Secondary | ICD-10-CM | POA: Diagnosis not present

## 2022-12-25 ENCOUNTER — Emergency Department (HOSPITAL_BASED_OUTPATIENT_CLINIC_OR_DEPARTMENT_OTHER)
Admission: EM | Admit: 2022-12-25 | Discharge: 2022-12-25 | Disposition: A | Discharge status: Home / Self Care | Payer: Medicare Other | Attending: Emergency Medicine | Admitting: Emergency Medicine

## 2022-12-25 ENCOUNTER — Emergency Department (HOSPITAL_BASED_OUTPATIENT_CLINIC_OR_DEPARTMENT_OTHER): Payer: Medicare Other

## 2022-12-25 ENCOUNTER — Other Ambulatory Visit: Payer: Self-pay

## 2022-12-25 ENCOUNTER — Encounter (HOSPITAL_BASED_OUTPATIENT_CLINIC_OR_DEPARTMENT_OTHER): Payer: Self-pay | Admitting: Emergency Medicine

## 2022-12-25 DIAGNOSIS — K449 Diaphragmatic hernia without obstruction or gangrene: Secondary | ICD-10-CM | POA: Diagnosis not present

## 2022-12-25 DIAGNOSIS — M4802 Spinal stenosis, cervical region: Secondary | ICD-10-CM | POA: Diagnosis not present

## 2022-12-25 DIAGNOSIS — I6523 Occlusion and stenosis of bilateral carotid arteries: Secondary | ICD-10-CM | POA: Diagnosis not present

## 2022-12-25 DIAGNOSIS — S32019A Unspecified fracture of first lumbar vertebra, initial encounter for closed fracture: Secondary | ICD-10-CM | POA: Diagnosis not present

## 2022-12-25 DIAGNOSIS — Z471 Aftercare following joint replacement surgery: Secondary | ICD-10-CM | POA: Diagnosis not present

## 2022-12-25 DIAGNOSIS — S3992XA Unspecified injury of lower back, initial encounter: Secondary | ICD-10-CM | POA: Diagnosis present

## 2022-12-25 DIAGNOSIS — M4804 Spinal stenosis, thoracic region: Secondary | ICD-10-CM | POA: Diagnosis not present

## 2022-12-25 DIAGNOSIS — M4316 Spondylolisthesis, lumbar region: Secondary | ICD-10-CM | POA: Diagnosis not present

## 2022-12-25 DIAGNOSIS — S32010A Wedge compression fracture of first lumbar vertebra, initial encounter for closed fracture: Secondary | ICD-10-CM | POA: Diagnosis not present

## 2022-12-25 DIAGNOSIS — Z91040 Latex allergy status: Secondary | ICD-10-CM | POA: Insufficient documentation

## 2022-12-25 DIAGNOSIS — W19XXXA Unspecified fall, initial encounter: Secondary | ICD-10-CM | POA: Diagnosis not present

## 2022-12-25 DIAGNOSIS — M5021 Other cervical disc displacement,  high cervical region: Secondary | ICD-10-CM | POA: Diagnosis not present

## 2022-12-25 DIAGNOSIS — M545 Low back pain, unspecified: Secondary | ICD-10-CM | POA: Diagnosis not present

## 2022-12-25 DIAGNOSIS — R9082 White matter disease, unspecified: Secondary | ICD-10-CM | POA: Insufficient documentation

## 2022-12-25 DIAGNOSIS — M47814 Spondylosis without myelopathy or radiculopathy, thoracic region: Secondary | ICD-10-CM | POA: Diagnosis not present

## 2022-12-25 DIAGNOSIS — I700 Atherosclerosis of aorta: Secondary | ICD-10-CM | POA: Diagnosis not present

## 2022-12-25 DIAGNOSIS — J439 Emphysema, unspecified: Secondary | ICD-10-CM | POA: Diagnosis not present

## 2022-12-25 DIAGNOSIS — M1611 Unilateral primary osteoarthritis, right hip: Secondary | ICD-10-CM | POA: Diagnosis not present

## 2022-12-25 DIAGNOSIS — M4807 Spinal stenosis, lumbosacral region: Secondary | ICD-10-CM | POA: Diagnosis not present

## 2022-12-25 DIAGNOSIS — M549 Dorsalgia, unspecified: Secondary | ICD-10-CM | POA: Diagnosis not present

## 2022-12-25 DIAGNOSIS — M47816 Spondylosis without myelopathy or radiculopathy, lumbar region: Secondary | ICD-10-CM | POA: Diagnosis not present

## 2022-12-25 DIAGNOSIS — W1839XA Other fall on same level, initial encounter: Secondary | ICD-10-CM | POA: Diagnosis not present

## 2022-12-25 DIAGNOSIS — R102 Pelvic and perineal pain: Secondary | ICD-10-CM | POA: Diagnosis not present

## 2022-12-25 DIAGNOSIS — M546 Pain in thoracic spine: Secondary | ICD-10-CM | POA: Diagnosis not present

## 2022-12-25 DIAGNOSIS — Z96642 Presence of left artificial hip joint: Secondary | ICD-10-CM | POA: Diagnosis not present

## 2022-12-25 MED ORDER — FENTANYL CITRATE PF 50 MCG/ML IJ SOSY
25.0000 ug | PREFILLED_SYRINGE | Freq: Once | INTRAMUSCULAR | Status: AC
  Administered 2022-12-25: 25 ug via INTRAMUSCULAR

## 2022-12-25 MED ORDER — FENTANYL CITRATE PF 50 MCG/ML IJ SOSY
25.0000 ug | PREFILLED_SYRINGE | Freq: Once | INTRAMUSCULAR | Status: DC
  Filled 2022-12-25: qty 1

## 2022-12-25 NOTE — ED Triage Notes (Signed)
Pt to ED via [facility] with c/o lower back pain s/p fall backward today; denies LOC, no thinners

## 2022-12-25 NOTE — Discharge Instructions (Addendum)
You are seen in the department concerns of a fall.  Functional standing compression fracture of the L1 vertebrae in the past.  This is likely the area of pain that you are currently experiencing as well.  I spoke with neurosurgery and they advise following up with her office outpatient the next 2 to 3 weeks.  An LSO brace may also be helpful for additional support.  If you have any worsening symptoms, return the emergency department.

## 2022-12-25 NOTE — ED Provider Notes (Signed)
 [name] Provider Note   CSN: [id number] Arrival date & time: 12/25/22  1350     History Chief Complaint  Patient presents with   Marletta Lor    Carmen Roy is a 89 y.o. female.  Patient past history significant for hypercholesterolemia, vertigo, thyroid disease presents the emergency department concerns of a fall.  Patient brought in by EMS after mechanical fall when she fell backwards and endorses pain in her lumbar spine.  Denies head strike, loss of consciousness, weakness, dizziness, or fatigue prior to fall.  Not on blood thinners.  Denies any areas of lacerations or abrasions from the fall.  States that she was playing with friends became a bridge when she lost her footing and fell backwards.  Denies any pain in the pelvis.   Fall       Home Medications Prior to Admission medications   Medication Sig Start Date End Date Taking? Authorizing Provider  atorvastatin (LIPITOR) 10 MG tablet Take 10 mg by mouth daily.    [provider]  Calcium Carb-Cholecalciferol (CALCIUM + D3 PO) Take by mouth.    [provider]  Fluticasone-Salmeterol (ADVAIR) 250-50 MCG/DOSE AEPB Inhale 1 puff twice daily.  Rinse and spit with each use. 08/21/19   [provider]  levothyroxine (SYNTHROID) 25 MCG tablet Take 25 mcg by mouth daily before breakfast.    [provider]  LYSINE PO Take by mouth.    [provider]  Multiple Vitamin (MULTIVITAMIN) capsule Take 1 capsule by mouth daily.    [provider]  oxybutynin (DITROPAN-XL) 10 MG 24 hr tablet Take 10 mg by mouth daily. 03/13/21   [provider]      Allergies    Latex    Review of Systems   Review of Systems  Musculoskeletal:  Positive for back pain.  All other systems reviewed and are negative.   Physical Exam Updated Vital Signs BP 135/69   Pulse 69   Temp 97.9 F (36.6 C) (Oral)   Resp 20   Wt 64.2 kg   SpO2 95%    BMI 27.65 kg/m  Physical Exam Vitals and nursing note reviewed.  Constitutional:      General: She is not in acute distress.    Appearance: She is well-developed.  HENT:     Head: Normocephalic and atraumatic.  Eyes:     Conjunctiva/sclera: Conjunctivae normal.  Cardiovascular:     Rate and Rhythm: Normal rate and regular rhythm.     Heart sounds: No murmur heard. Pulmonary:     Effort: Pulmonary effort is normal. No respiratory distress.     Breath sounds: Normal breath sounds.  Abdominal:     Palpations: Abdomen is soft.     Tenderness: There is no abdominal tenderness.  Musculoskeletal:        General: Tenderness present. No swelling. Normal range of motion.       Arms:     Cervical back: Neck supple.     Comments: Midline tenderness along bottom thoracic/upper lumbar spine. No visible deformities or step offs.  Movement difficult due to pain in back.  Skin:    General: Skin is warm and dry.     Capillary Refill: Capillary refill takes less than 2 seconds.  Neurological:     Mental Status: She is alert.  Psychiatric:        Mood and Affect: Mood normal.     ED Results / Procedures / Treatments  Labs (all labs ordered are listed, but only abnormal results are displayed) Labs Reviewed - No data to display  EKG None  Radiology CT Lumbar Spine Wo Contrast  Result Date: 12/25/2022 CLINICAL DATA:  Fall.  Lumbar spine pain.  Abnormal radiographs. EXAM: CT LUMBAR SPINE WITHOUT CONTRAST TECHNIQUE: Multidetector CT imaging of the lumbar spine was performed without intravenous contrast administration. Multiplanar CT image reconstructions were also generated. RADIATION DOSE REDUCTION: This exam was performed according to the departmental dose-optimization program which includes automated exposure control, adjustment of the mA and/or kV according to patient size and/or use of iterative reconstruction technique. COMPARISON:  Lumbar spine radiographs/27/24 CT of the abdomen  pelvis 09/26/2017 FINDINGS: Segmentation: 5 non rib-bearing lumbar type vertebral bodies are present. The lowest fully formed vertebral body is L5. Alignment: Grade 1 anterolisthesis at L4-5 measures 7 mm. Dextroconvex curvature of the lumbar spine is centered at L2. Compensatory levoconvex curvature is present at L4-5. Vertebrae: A superior endplate fractures present at L1. 20% loss of height is present without significant retropulsed bone. No other fractures are present. Paraspinal and other soft tissues: Paraspinous soft tissue stranding and hemorrhage is present at the L1 level. Paraspinous soft tissues are otherwise within normal limits. Atherosclerotic calcifications are present in the aorta without aneurysm. Disc levels: T12-L1: Insert normal disc L1-2: Normal disc signal and height is present. No focal protrusion or stenosis is present. L2-3: A leftward disc protrusion is present. Chronic loss of disc height is present. Mild left subarticular in foraminal narrowing is present. L3-4: A broad-based disc protrusion is present. Moderate facet hypertrophy and ligamentum flavum thickening is noted. Moderate left and mild right subarticular narrowing is present. Mild left foraminal stenosis is present. L4-5: A broad-based disc protrusion is present. Advanced facet hypertrophy and ligamentum flavum thickening is noted. This results in moderate central canal stenosis. Moderate to severe right and mild left foraminal stenosis is present. L5-S1: Moderate facet hypertrophy and spurring is present bilaterally. Chronic loss of disc height is present. Endplate spurring contributes to moderate right and mild left foraminal stenosis. IMPRESSION: 1. Superior endplate fracture at L1 with 20% loss of height and no significant retropulsed bone. 2. Multilevel degenerative changes of the lumbar spine as described. 3. Moderate central canal stenosis at L4-5. 4. Moderate to severe right and mild left foraminal stenosis at L4-5. 5.  Moderate right and mild left foraminal stenosis at L5-S1. 6. Mild left subarticular and foraminal narrowing at L2-3. 7. Moderate left and mild right subarticular narrowing at L3-4. 8.  Aortic Atherosclerosis (ICD10-I70.0). Electronically Signed   By: Marin Roberts M.D.   On: 12/25/2022 19:27   CT Thoracic Spine Wo Contrast  Result Date: 12/25/2022 CLINICAL DATA:  Fall.  Back pain. EXAM: CT THORACIC SPINE WITHOUT CONTRAST TECHNIQUE: Multidetector CT images of the thoracic were obtained using the standard protocol without intravenous contrast. RADIATION DOSE REDUCTION: This exam was performed according to the departmental dose-optimization program which includes automated exposure control, adjustment of the mA and/or kV according to patient size and/or use of iterative reconstruction technique. COMPARISON:  Thoracic spine radiographs 12/25/2022 and two-view chest x-ray 07/08/2013 FINDINGS: Alignment: Slight anterolisthesis is present at T1-2 and T2-3. No other significant listhesis is present. Mild rightward curvature is present in the midthoracic spine. Upper thoracic kyphosis is somewhat exaggerated. Vertebrae: Vertebral body heights and alignment normal. No acute fractures present. Paraspinal and other soft tissues: The paraspinous soft tissues are within normal limits. A large hiatal hernia is present. Coronary artery calcifications are present.  Atherosclerotic calcifications are present in the aortic arch. Mild dependent atelectasis is present in both lungs. The visualized upper abdomen is within normal limits. Disc levels: Facet hypertrophy contributes to mild bilateral foraminal stenosis at T1-2. No other significant foraminal narrowing is present in the thoracic spine. No significant central canal stenosis is present. IMPRESSION: 1. No acute fracture or traumatic subluxation. 2. Mild bilateral foraminal stenosis at T1-2 secondary to facet hypertrophy. 3. Large hiatal hernia. 4. Coronary artery  disease. 5.  Aortic Atherosclerosis (ICD10-I70.0). Electronically Signed   By: Marin Roberts M.D.   On: 12/25/2022 19:19   CT Head Wo Contrast  Result Date: 12/25/2022 CLINICAL DATA:  Larey Seat backwards today. EXAM: CT HEAD WITHOUT CONTRAST TECHNIQUE: Contiguous axial images were obtained from the base of the skull through the vertex without intravenous contrast. RADIATION DOSE REDUCTION: This exam was performed according to the departmental dose-optimization program which includes automated exposure control, adjustment of the mA and/or kV according to patient size and/or use of iterative reconstruction technique. COMPARISON:  None Available. FINDINGS: Brain: No acute infarct, hemorrhage, or mass lesion is present. Mild atrophy and white matter changes are present bilaterally. Deep brain nuclei are within normal limits. The ventricles are of normal size. No significant extraaxial fluid collection is present. The brainstem and cerebellum are within normal limits. Midline structures are within normal limits. Vascular: Atherosclerotic calcifications are present within the cavernous internal carotid arteries bilaterally. No hyperdense vessel is present. Skull: Calvarium is intact. No focal lytic or blastic lesions are present. No significant extracranial soft tissue lesion is present. Sinuses/Orbits: Bilateral lens replacements are noted. Globes and orbits are otherwise unremarkable. IMPRESSION: 1. No acute intracranial abnormality or significant traumatic injury. 2. Mild atrophy and white matter disease likely reflects the sequela of chronic microvascular ischemia. Electronically Signed   By: Marin Roberts M.D.   On: 12/25/2022 19:12   CT Cervical Spine Wo Contrast  Result Date: 12/25/2022 CLINICAL DATA:  Fall today.  Back pain. EXAM: CT CERVICAL SPINE WITHOUT CONTRAST TECHNIQUE: Multidetector CT imaging of the cervical spine was performed without intravenous contrast. Multiplanar CT image  reconstructions were also generated. RADIATION DOSE REDUCTION: This exam was performed according to the departmental dose-optimization program which includes automated exposure control, adjustment of the mA and/or kV according to patient size and/or use of iterative reconstruction technique. COMPARISON:  None Available. FINDINGS: Alignment: Slight retrolisthesis at C3-4 is fused. Grade 1 anterolisthesis is present at T1-2 and T2-3. Exaggerated thoracic kyphosis is present. Skull base and vertebrae: Prominent pannus formation is present at C1-2 some erosions in the dens suggesting inflammatory arthritis. No acute fracture is present. Soft tissues and spinal canal: No prevertebral fluid or swelling. No visible canal hematoma. Disc levels: Ankylosis is present at C2-3 C3-4 C4-5 and C5-6. Severe right and moderate left foraminal stenosis is present at C3-4 due to uncovertebral spurring. Moderate foraminal stenosis is present bilaterally at C5-6, right greater than left. Moderate right and mild left foraminal narrowing is present at C6-7. Upper chest: Mild centrilobular emphysematous changes are present. Scarring is present at the lung apices bilaterally. IMPRESSION: 1. No acute fracture or traumatic subluxation. 2. Ankylosis at C2-3 C3-4 C4-5 and C5-6. 3. Severe right and moderate left foraminal stenosis at C3-4. 4. Moderate foraminal stenosis bilaterally at C5-6, right greater than left. 5. Moderate right and mild left foraminal narrowing at C6-7. 6. Prominent pannus formation at C1-2 with some erosions in the dens suggesting inflammatory arthritis. 7.  Emphysema (ICD10-J43.9). Electronically Signed   By:  Marin Roberts M.D.   On: 12/25/2022 19:09   DG Lumbar Spine Complete  Result Date: 12/25/2022 CLINICAL DATA:  Mid and lower back pain after a fall. EXAM: LUMBAR SPINE - COMPLETE 4+ VIEW COMPARISON:  CT abdomen and pelvis dated 09/26/2017. FINDINGS: There is dextrocurvature of the upper lumbar spine. There  is an age-indeterminate compression deformity of L1 and L2 with less than 25% height loss centrally and no retropulsion into the central canal. There is 4 mm anterolisthesis of L4 on L5. Moderate to severe multilevel degenerative disc and joint disease. IMPRESSION: Age-indeterminate compression deformities of L1 and L2. Electronically Signed   By: Romona Curls M.D.   On: 12/25/2022 16:52   DG Pelvis Portable  Result Date: 12/25/2022 CLINICAL DATA:  Fall with pelvic pain. EXAM: PORTABLE PELVIS 1-2 VIEWS COMPARISON:  CT abdomen and pelvis dated 09/26/2017. FINDINGS: The patient is status post a total left hip arthroplasty. The hardware appears intact. No joint dislocation. There is no evidence of pelvic fracture or diastasis. Mild right hip osteoarthritis. IMPRESSION: No acute osseous injury. Electronically Signed   By: Romona Curls M.D.   On: 12/25/2022 16:50   DG Thoracic Spine 2 View  Result Date: 12/25/2022 CLINICAL DATA:  Mid and lower back pain after a fall. EXAM: THORACIC SPINE 2 VIEWS COMPARISON:  CT abdomen and pelvis dated 09/26/2017. FINDINGS: There is age indeterminate compression deformities with up to 25% height loss centrally at T11 and T12. No retropulsion into the central canal. Alignment is normal. Degenerative changes are seen in the spine. IMPRESSION: Age indeterminate compression deformities at T11 and T12. Electronically Signed   By: Romona Curls M.D.   On: 12/25/2022 16:49    Procedures Procedures   Medications Ordered in ED Medications  fentaNYL (SUBLIMAZE) injection 25 mcg (25 mcg Intramuscular Given 12/25/22 1805)    ED Course/ Medical Decision Making/ A&P                               Medical Decision Making Amount and/or Complexity of Data Reviewed Radiology: ordered.  Risk Prescription drug management.   This patient presents to the ED for concern of fall.  Differential diagnosis includes lumbar strain, vertebral fracture, cauda equina syndrome,  syncope   Imaging Studies ordered:  I ordered imaging studies including x-ray thoracic spine, x-ray lumbar spine, x-ray, CT head, cervical spine, thoracic spine, and lumbar spine I independently visualized and interpreted imaging which showed age indeterminate fractures of T11, T12, L1, and L2. CT shows L1 fracture with 20% height loss, no significant retropulsed bone I agree with the radiologist interpretation   Problem List / ED Course:  Patient with past history significant for hypercholesterolemia, vertigo, thyroid disease presents emergency department with concerns of a fall.  Patient brought in by EMS reportedly after experiencing a mechanical fall while she was playing a game of bridge with her friends.  Landed on her back/thoracic spine area and is currently point tenderness.  No visible deformities.  Paresthesias, numbness, or altered sensation in the lower extremities.  No loss of bowel or bladder function.  Given area of pain, will order x-ray imaging for the low back as well as a pelvis given notable pain when patient tries to move around her chest around.  May require CT imaging.  Currently does not feel that she needs any pain medication.  Given no evidence of any head strike, MI, blood thinners, do not feel that  CT imaging of the head or cervical spine are needed. Extremity weakness was possible T11-L2 compression fractures.  Given that this is the area of pain, proceed with CT imaging of these areas.  On further discussion with patient, unclear patient did hit her head or not.  No perceived CT head and cervical spine imaging as well.  Pain well-controlled with fentanyl. CT lumbar with mild compression deformity of the L1 vertebrae with 20% height loss. Spoke with Vick Frees, PA neurosurgery, regarding patient care. LSO brace helpful for symptom control. Can follow up with neurosurgery in 2-3 weeks, no acute indication for surgery at this time. Patient does not feel that she is requiring  further pain control at home and will take OTC medications. Advised reaching out to PCP or neurosurgery if concerned that pain is acutely worsening. Discharged home in stable condition.   Final Clinical Impression(s) / ED Diagnoses Final diagnoses:  Fall, initial encounter  Closed compression fracture of body of L1 vertebra [facility])    Rx / DC Orders ED Discharge Orders          Ordered    Outside Vendor Brace  Status:  Canceled       Comments: [facility] [address], [place], [place] [address]   12/25/22 2045    Outside Vendor Brace       Comments: [facility] [address], [place], [place] [address] LSO Brace   12/25/22 2046              Salomon Mast 12/25/22 2322    Laurence Spates, MD 12/26/22 [phone]

## 2022-12-27 DIAGNOSIS — S32010D Wedge compression fracture of first lumbar vertebra, subsequent encounter for fracture with routine healing: Secondary | ICD-10-CM | POA: Diagnosis not present

## 2022-12-27 DIAGNOSIS — J309 Allergic rhinitis, unspecified: Secondary | ICD-10-CM | POA: Diagnosis not present

## 2022-12-27 DIAGNOSIS — M8000XA Age-related osteoporosis with current pathological fracture, unspecified site, initial encounter for fracture: Secondary | ICD-10-CM | POA: Diagnosis not present

## 2022-12-27 DIAGNOSIS — J449 Chronic obstructive pulmonary disease, unspecified: Secondary | ICD-10-CM | POA: Diagnosis not present

## 2022-12-27 DIAGNOSIS — M1990 Unspecified osteoarthritis, unspecified site: Secondary | ICD-10-CM | POA: Diagnosis not present

## 2023-01-06 ENCOUNTER — Other Ambulatory Visit (HOSPITAL_COMMUNITY): Payer: Self-pay | Admitting: *Deleted

## 2023-01-09 ENCOUNTER — Ambulatory Visit (HOSPITAL_COMMUNITY)
Admission: RE | Admit: 2023-01-09 | Discharge: 2023-01-09 | Disposition: A | Discharge status: Home / Self Care | Payer: Medicare Other | Source: Ambulatory Visit | Attending: Internal Medicine | Admitting: Internal Medicine

## 2023-01-09 DIAGNOSIS — M810 Age-related osteoporosis without current pathological fracture: Secondary | ICD-10-CM | POA: Diagnosis not present

## 2023-01-09 MED ORDER — DENOSUMAB 60 MG/ML ~~LOC~~ SOSY
PREFILLED_SYRINGE | SUBCUTANEOUS | Status: AC
  Filled 2023-01-09: qty 1

## 2023-01-09 MED ORDER — DENOSUMAB 60 MG/ML ~~LOC~~ SOSY
60.0000 mg | PREFILLED_SYRINGE | Freq: Once | SUBCUTANEOUS | Status: AC
  Administered 2023-01-09: 60 mg via SUBCUTANEOUS

## 2023-01-17 DIAGNOSIS — S32010A Wedge compression fracture of first lumbar vertebra, initial encounter for closed fracture: Secondary | ICD-10-CM | POA: Diagnosis not present

## 2023-02-14 DIAGNOSIS — S32010A Wedge compression fracture of first lumbar vertebra, initial encounter for closed fracture: Secondary | ICD-10-CM | POA: Diagnosis not present

## 2023-02-14 DIAGNOSIS — S32010D Wedge compression fracture of first lumbar vertebra, subsequent encounter for fracture with routine healing: Secondary | ICD-10-CM | POA: Diagnosis not present

## 2023-02-16 DIAGNOSIS — E871 Hypo-osmolality and hyponatremia: Secondary | ICD-10-CM | POA: Diagnosis not present

## 2023-02-16 DIAGNOSIS — Z1331 Encounter for screening for depression: Secondary | ICD-10-CM | POA: Diagnosis not present

## 2023-02-16 DIAGNOSIS — J449 Chronic obstructive pulmonary disease, unspecified: Secondary | ICD-10-CM | POA: Diagnosis not present

## 2023-02-16 DIAGNOSIS — N8110 Cystocele, unspecified: Secondary | ICD-10-CM | POA: Diagnosis not present

## 2023-02-16 DIAGNOSIS — S32010D Wedge compression fracture of first lumbar vertebra, subsequent encounter for fracture with routine healing: Secondary | ICD-10-CM | POA: Diagnosis not present

## 2023-02-16 DIAGNOSIS — E559 Vitamin D deficiency, unspecified: Secondary | ICD-10-CM | POA: Diagnosis not present

## 2023-02-16 DIAGNOSIS — I700 Atherosclerosis of aorta: Secondary | ICD-10-CM | POA: Diagnosis not present

## 2023-02-16 DIAGNOSIS — E039 Hypothyroidism, unspecified: Secondary | ICD-10-CM | POA: Diagnosis not present

## 2023-02-16 DIAGNOSIS — E7800 Pure hypercholesterolemia, unspecified: Secondary | ICD-10-CM | POA: Diagnosis not present

## 2023-02-16 DIAGNOSIS — F325 Major depressive disorder, single episode, in full remission: Secondary | ICD-10-CM | POA: Diagnosis not present

## 2023-02-16 DIAGNOSIS — Z0000 Encounter for general adult medical examination without abnormal findings: Secondary | ICD-10-CM | POA: Diagnosis not present

## 2023-02-16 DIAGNOSIS — Z1339 Encounter for screening examination for other mental health and behavioral disorders: Secondary | ICD-10-CM | POA: Diagnosis not present

## 2023-02-16 DIAGNOSIS — M8000XA Age-related osteoporosis with current pathological fracture, unspecified site, initial encounter for fracture: Secondary | ICD-10-CM | POA: Diagnosis not present

## 2023-02-16 DIAGNOSIS — G629 Polyneuropathy, unspecified: Secondary | ICD-10-CM | POA: Diagnosis not present

## 2023-02-27 ENCOUNTER — Encounter: Payer: Self-pay | Admitting: Obstetrics and Gynecology

## 2023-02-27 ENCOUNTER — Ambulatory Visit (INDEPENDENT_AMBULATORY_CARE_PROVIDER_SITE_OTHER): Payer: Medicare Other | Admitting: Obstetrics and Gynecology

## 2023-02-27 VITALS — BP 120/64 | HR 69 | Wt 114.0 lb

## 2023-02-27 DIAGNOSIS — N8110 Cystocele, unspecified: Secondary | ICD-10-CM | POA: Insufficient documentation

## 2023-02-27 NOTE — Assessment & Plan Note (Signed)
Discussed progression of cystocele over time. Asymptomatic cystocele, previously managed with pessary however discontinued due to patient intolerance.  Discussed retrial of pessary versus continued expectant management with patient's daughter-in-law.  She would like to continue expectant management at this time. All questions answered.  Return to office as needed.

## 2023-02-27 NOTE — Progress Notes (Signed)
89 y.o. [id number] female s/p hysterectomy with known grade 2 cystocele here for follow-up.  She presents with her daughter-in-law.  Daughter-in-law reports that the nursing home noted " something outside of the patient last week." The patient denies any current pelvic pain, vaginal bleeding, dysuria, urinary retention, or constipation.  However she is unable to provide any details regarding the incident last week.  They both deny history of recent UTIs.  Gehrung pessary was removed in September 2023 due to pain with insertion.  On follow-up exam patient was doing well without pessary placement so patient was managed expectantly.  No LMP recorded. Patient is postmenopausal.    GYN HISTORY: Prior hysterectomy  OB History  Gravida Para Term Preterm AB Living  6 5   1 5   SAB IAB Ectopic Multiple Live Births  1        # Outcome Date GA Lbr Len/2nd Weight Sex Type Anes PTL Lv  6 Para           5 Para           4 Para           3 Para           2 Para           1 SAB             Past Medical History:  Diagnosis Date   Hypercholesteremia    Thyroid disease    Urinary urgency    Currently on experimental study with trigger point and vibration for 12 weeks, 04/17/16   Vertigo     Past Surgical History:  Procedure Laterality Date   APPENDECTOMY     HIP SURGERY     TOE SURGERY      Current Outpatient Medications on File Prior to Visit  Medication Sig Dispense Refill   acetaminophen (TYLENOL) 325 MG tablet      atorvastatin (LIPITOR) 10 MG tablet Take 10 mg by mouth daily.     Calcium Carb-Cholecalciferol (CALCIUM + D3 PO) Take by mouth.     levothyroxine (SYNTHROID) 25 MCG tablet Take 50 mcg by mouth daily before breakfast.     LYSINE PO Take by mouth.     Multiple Vitamin (MULTIVITAMIN) capsule Take 1 capsule by mouth daily.     Multiple Vitamin (MULTIVITAMIN) capsule Take by mouth.     Vibegron (GEMTESA) 75 MG TABS Take 75 mg by mouth daily.     Fluticasone-Salmeterol  (ADVAIR) 250-50 MCG/DOSE AEPB Inhale 1 puff twice daily.  Rinse and spit with each use. (Patient not taking: Reported on 02/27/2023)     No current facility-administered medications on file prior to visit.    Allergies  Allergen Reactions   Latex Rash      PE Today's Vitals   02/27/23 1020  BP: 120/64  Pulse: 69  SpO2: 98%  Weight: 114 lb (51.7 kg)   Body mass index is 22.26 kg/m.  Physical Exam Vitals reviewed. Exam conducted with a chaperone present.  Constitutional:      General: She is not in acute distress.    Appearance: Normal appearance.  HENT:     Head: Normocephalic and atraumatic.     Nose: Nose normal.  Eyes:     Extraocular Movements: Extraocular movements intact.     Conjunctiva/sclera: Conjunctivae normal.  Pulmonary:     Effort: Pulmonary effort is normal.  Genitourinary:    General: Normal vulva.     Exam position: Lithotomy position.  Vagina: Normal. No vaginal discharge.     Cervix: Normal. No cervical motion tenderness, discharge or lesion.     Uterus: Normal. Not enlarged and not tender.      Adnexa: Right adnexa normal and left adnexa normal.     Comments: Grade 2 cystocele Musculoskeletal:        General: Normal range of motion.     Cervical back: Normal range of motion.  Neurological:     General: No focal deficit present.     Mental Status: She is alert.  Psychiatric:        Mood and Affect: Mood normal.        Behavior: Behavior normal.       Assessment and Plan:        Baden-Walker grade 2 cystocele Assessment & Plan: Discussed progression of cystocele over time. Asymptomatic cystocele, previously managed with pessary however discontinued due to patient intolerance.  Discussed retrial of pessary versus continued expectant management with patient's daughter-in-law.  She would like to continue expectant management at this time. All questions answered.  Return to office as needed.     25 min  total time was spent for this  patient encounter, including preparation, face-to-face counseling with the patient, coordination of care, and documentation of the encounter.  Rosalyn Gess, MD

## 2023-04-05 DIAGNOSIS — E039 Hypothyroidism, unspecified: Secondary | ICD-10-CM | POA: Diagnosis not present

## 2023-04-05 DIAGNOSIS — E7800 Pure hypercholesterolemia, unspecified: Secondary | ICD-10-CM | POA: Diagnosis not present

## 2023-05-16 DIAGNOSIS — R42 Dizziness and giddiness: Secondary | ICD-10-CM | POA: Diagnosis not present

## 2023-05-16 DIAGNOSIS — N390 Urinary tract infection, site not specified: Secondary | ICD-10-CM | POA: Diagnosis not present

## 2023-05-16 DIAGNOSIS — E871 Hypo-osmolality and hyponatremia: Secondary | ICD-10-CM | POA: Diagnosis not present

## 2023-05-31 DIAGNOSIS — Z23 Encounter for immunization: Secondary | ICD-10-CM | POA: Diagnosis not present

## 2023-06-13 DIAGNOSIS — M25552 Pain in left hip: Secondary | ICD-10-CM | POA: Diagnosis not present

## 2023-06-13 DIAGNOSIS — M8000XA Age-related osteoporosis with current pathological fracture, unspecified site, initial encounter for fracture: Secondary | ICD-10-CM | POA: Diagnosis not present

## 2023-06-13 DIAGNOSIS — S32010D Wedge compression fracture of first lumbar vertebra, subsequent encounter for fracture with routine healing: Secondary | ICD-10-CM | POA: Diagnosis not present

## 2023-06-13 DIAGNOSIS — M545 Low back pain, unspecified: Secondary | ICD-10-CM | POA: Diagnosis not present

## 2023-07-07 DIAGNOSIS — R410 Disorientation, unspecified: Secondary | ICD-10-CM | POA: Diagnosis not present

## 2023-07-07 DIAGNOSIS — R0902 Hypoxemia: Secondary | ICD-10-CM | POA: Diagnosis not present

## 2023-07-07 DIAGNOSIS — J449 Chronic obstructive pulmonary disease, unspecified: Secondary | ICD-10-CM | POA: Diagnosis not present

## 2023-07-07 DIAGNOSIS — E871 Hypo-osmolality and hyponatremia: Secondary | ICD-10-CM | POA: Diagnosis not present

## 2023-07-10 DIAGNOSIS — I739 Peripheral vascular disease, unspecified: Secondary | ICD-10-CM | POA: Diagnosis not present

## 2023-07-10 DIAGNOSIS — B351 Tinea unguium: Secondary | ICD-10-CM | POA: Diagnosis not present

## 2023-07-10 DIAGNOSIS — L603 Nail dystrophy: Secondary | ICD-10-CM | POA: Diagnosis not present

## 2023-07-10 DIAGNOSIS — L84 Corns and callosities: Secondary | ICD-10-CM | POA: Diagnosis not present

## 2023-07-10 DIAGNOSIS — L600 Ingrowing nail: Secondary | ICD-10-CM | POA: Diagnosis not present

## 2023-08-03 DIAGNOSIS — R0902 Hypoxemia: Secondary | ICD-10-CM | POA: Diagnosis not present

## 2023-08-03 DIAGNOSIS — N3001 Acute cystitis with hematuria: Secondary | ICD-10-CM | POA: Diagnosis not present

## 2023-08-03 DIAGNOSIS — N3281 Overactive bladder: Secondary | ICD-10-CM | POA: Diagnosis not present

## 2023-08-03 DIAGNOSIS — E785 Hyperlipidemia, unspecified: Secondary | ICD-10-CM | POA: Diagnosis not present

## 2023-08-03 DIAGNOSIS — A4151 Sepsis due to Escherichia coli [E. coli]: Secondary | ICD-10-CM | POA: Diagnosis not present

## 2023-08-03 DIAGNOSIS — R531 Weakness: Secondary | ICD-10-CM | POA: Diagnosis not present

## 2023-08-03 DIAGNOSIS — N3000 Acute cystitis without hematuria: Secondary | ICD-10-CM | POA: Diagnosis not present

## 2023-08-03 DIAGNOSIS — E039 Hypothyroidism, unspecified: Secondary | ICD-10-CM | POA: Diagnosis not present

## 2023-08-03 DIAGNOSIS — J449 Chronic obstructive pulmonary disease, unspecified: Secondary | ICD-10-CM | POA: Diagnosis not present

## 2023-08-03 DIAGNOSIS — R6520 Severe sepsis without septic shock: Secondary | ICD-10-CM | POA: Diagnosis not present

## 2023-08-03 DIAGNOSIS — J189 Pneumonia, unspecified organism: Secondary | ICD-10-CM | POA: Diagnosis not present

## 2023-08-03 DIAGNOSIS — J440 Chronic obstructive pulmonary disease with acute lower respiratory infection: Secondary | ICD-10-CM | POA: Diagnosis not present

## 2023-08-03 DIAGNOSIS — R0682 Tachypnea, not elsewhere classified: Secondary | ICD-10-CM | POA: Diagnosis not present

## 2023-08-03 DIAGNOSIS — Z136 Encounter for screening for cardiovascular disorders: Secondary | ICD-10-CM | POA: Diagnosis not present

## 2023-08-03 DIAGNOSIS — N3289 Other specified disorders of bladder: Secondary | ICD-10-CM | POA: Diagnosis not present

## 2023-08-03 DIAGNOSIS — R0689 Other abnormalities of breathing: Secondary | ICD-10-CM | POA: Diagnosis not present

## 2023-08-03 DIAGNOSIS — R059 Cough, unspecified: Secondary | ICD-10-CM | POA: Diagnosis not present

## 2023-08-03 DIAGNOSIS — E871 Hypo-osmolality and hyponatremia: Secondary | ICD-10-CM | POA: Diagnosis not present

## 2023-08-03 DIAGNOSIS — I10 Essential (primary) hypertension: Secondary | ICD-10-CM | POA: Diagnosis not present

## 2023-08-03 DIAGNOSIS — R918 Other nonspecific abnormal finding of lung field: Secondary | ICD-10-CM | POA: Diagnosis not present

## 2023-08-03 DIAGNOSIS — R509 Fever, unspecified: Secondary | ICD-10-CM | POA: Diagnosis not present

## 2023-08-29 DIAGNOSIS — H8110 Benign paroxysmal vertigo, unspecified ear: Secondary | ICD-10-CM | POA: Diagnosis not present

## 2023-08-29 DIAGNOSIS — M8008XD Age-related osteoporosis with current pathological fracture, vertebra(e), subsequent encounter for fracture with routine healing: Secondary | ICD-10-CM | POA: Diagnosis not present

## 2023-08-29 DIAGNOSIS — M1990 Unspecified osteoarthritis, unspecified site: Secondary | ICD-10-CM | POA: Diagnosis not present

## 2023-08-29 DIAGNOSIS — E7800 Pure hypercholesterolemia, unspecified: Secondary | ICD-10-CM | POA: Diagnosis not present

## 2023-08-29 DIAGNOSIS — J449 Chronic obstructive pulmonary disease, unspecified: Secondary | ICD-10-CM | POA: Diagnosis not present

## 2023-08-29 DIAGNOSIS — H9190 Unspecified hearing loss, unspecified ear: Secondary | ICD-10-CM | POA: Diagnosis not present

## 2023-08-29 DIAGNOSIS — J309 Allergic rhinitis, unspecified: Secondary | ICD-10-CM | POA: Diagnosis not present

## 2023-08-29 DIAGNOSIS — F325 Major depressive disorder, single episode, in full remission: Secondary | ICD-10-CM | POA: Diagnosis not present

## 2023-08-29 DIAGNOSIS — E871 Hypo-osmolality and hyponatremia: Secondary | ICD-10-CM | POA: Diagnosis not present

## 2023-08-29 DIAGNOSIS — G629 Polyneuropathy, unspecified: Secondary | ICD-10-CM | POA: Diagnosis not present

## 2023-08-29 DIAGNOSIS — N8110 Cystocele, unspecified: Secondary | ICD-10-CM | POA: Diagnosis not present

## 2023-08-29 DIAGNOSIS — E039 Hypothyroidism, unspecified: Secondary | ICD-10-CM | POA: Diagnosis not present

## 2023-08-31 DIAGNOSIS — I10 Essential (primary) hypertension: Secondary | ICD-10-CM | POA: Diagnosis not present

## 2023-08-31 DIAGNOSIS — Z79890 Hormone replacement therapy (postmenopausal): Secondary | ICD-10-CM | POA: Diagnosis not present

## 2023-08-31 DIAGNOSIS — R54 Age-related physical debility: Secondary | ICD-10-CM | POA: Diagnosis not present

## 2023-08-31 DIAGNOSIS — M419 Scoliosis, unspecified: Secondary | ICD-10-CM | POA: Diagnosis not present

## 2023-08-31 DIAGNOSIS — N390 Urinary tract infection, site not specified: Secondary | ICD-10-CM | POA: Diagnosis not present

## 2023-08-31 DIAGNOSIS — N3001 Acute cystitis with hematuria: Secondary | ICD-10-CM | POA: Diagnosis not present

## 2023-08-31 DIAGNOSIS — R7881 Bacteremia: Secondary | ICD-10-CM | POA: Diagnosis not present

## 2023-08-31 DIAGNOSIS — F419 Anxiety disorder, unspecified: Secondary | ICD-10-CM | POA: Diagnosis not present

## 2023-08-31 DIAGNOSIS — R41841 Cognitive communication deficit: Secondary | ICD-10-CM | POA: Diagnosis not present

## 2023-08-31 DIAGNOSIS — M4802 Spinal stenosis, cervical region: Secondary | ICD-10-CM | POA: Diagnosis not present

## 2023-08-31 DIAGNOSIS — Z7951 Long term (current) use of inhaled steroids: Secondary | ICD-10-CM | POA: Diagnosis not present

## 2023-08-31 DIAGNOSIS — R2689 Other abnormalities of gait and mobility: Secondary | ICD-10-CM | POA: Diagnosis not present

## 2023-08-31 DIAGNOSIS — Z743 Need for continuous supervision: Secondary | ICD-10-CM | POA: Diagnosis not present

## 2023-08-31 DIAGNOSIS — E860 Dehydration: Secondary | ICD-10-CM | POA: Diagnosis not present

## 2023-08-31 DIAGNOSIS — Z87440 Personal history of urinary (tract) infections: Secondary | ICD-10-CM | POA: Diagnosis not present

## 2023-08-31 DIAGNOSIS — I959 Hypotension, unspecified: Secondary | ICD-10-CM | POA: Diagnosis not present

## 2023-08-31 DIAGNOSIS — Z66 Do not resuscitate: Secondary | ICD-10-CM | POA: Diagnosis not present

## 2023-08-31 DIAGNOSIS — Z7409 Other reduced mobility: Secondary | ICD-10-CM | POA: Diagnosis not present

## 2023-08-31 DIAGNOSIS — R55 Syncope and collapse: Secondary | ICD-10-CM | POA: Diagnosis not present

## 2023-08-31 DIAGNOSIS — B9620 Unspecified Escherichia coli [E. coli] as the cause of diseases classified elsewhere: Secondary | ICD-10-CM | POA: Diagnosis not present

## 2023-08-31 DIAGNOSIS — Z79899 Other long term (current) drug therapy: Secondary | ICD-10-CM | POA: Diagnosis not present

## 2023-08-31 DIAGNOSIS — R2681 Unsteadiness on feet: Secondary | ICD-10-CM | POA: Diagnosis not present

## 2023-08-31 DIAGNOSIS — J449 Chronic obstructive pulmonary disease, unspecified: Secondary | ICD-10-CM | POA: Diagnosis not present

## 2023-08-31 DIAGNOSIS — E872 Acidosis, unspecified: Secondary | ICD-10-CM | POA: Diagnosis not present

## 2023-08-31 DIAGNOSIS — M47812 Spondylosis without myelopathy or radiculopathy, cervical region: Secondary | ICD-10-CM | POA: Diagnosis not present

## 2023-08-31 DIAGNOSIS — Z87891 Personal history of nicotine dependence: Secondary | ICD-10-CM | POA: Diagnosis not present

## 2023-08-31 DIAGNOSIS — E039 Hypothyroidism, unspecified: Secondary | ICD-10-CM | POA: Diagnosis not present

## 2023-08-31 DIAGNOSIS — Z789 Other specified health status: Secondary | ICD-10-CM | POA: Diagnosis not present

## 2023-08-31 DIAGNOSIS — E785 Hyperlipidemia, unspecified: Secondary | ICD-10-CM | POA: Diagnosis not present

## 2023-08-31 DIAGNOSIS — M6281 Muscle weakness (generalized): Secondary | ICD-10-CM | POA: Diagnosis not present

## 2023-08-31 DIAGNOSIS — R9082 White matter disease, unspecified: Secondary | ICD-10-CM | POA: Diagnosis not present

## 2023-09-04 DIAGNOSIS — M6281 Muscle weakness (generalized): Secondary | ICD-10-CM | POA: Diagnosis not present

## 2023-09-04 DIAGNOSIS — B9620 Unspecified Escherichia coli [E. coli] as the cause of diseases classified elsewhere: Secondary | ICD-10-CM | POA: Diagnosis not present

## 2023-09-04 DIAGNOSIS — R2681 Unsteadiness on feet: Secondary | ICD-10-CM | POA: Diagnosis not present

## 2023-09-04 DIAGNOSIS — Z7409 Other reduced mobility: Secondary | ICD-10-CM | POA: Diagnosis not present

## 2023-09-04 DIAGNOSIS — I10 Essential (primary) hypertension: Secondary | ICD-10-CM | POA: Diagnosis not present

## 2023-09-04 DIAGNOSIS — R54 Age-related physical debility: Secondary | ICD-10-CM | POA: Diagnosis not present

## 2023-09-04 DIAGNOSIS — F419 Anxiety disorder, unspecified: Secondary | ICD-10-CM | POA: Diagnosis not present

## 2023-09-04 DIAGNOSIS — N3001 Acute cystitis with hematuria: Secondary | ICD-10-CM | POA: Diagnosis not present

## 2023-09-04 DIAGNOSIS — R2689 Other abnormalities of gait and mobility: Secondary | ICD-10-CM | POA: Diagnosis not present

## 2023-09-04 DIAGNOSIS — R7881 Bacteremia: Secondary | ICD-10-CM | POA: Diagnosis not present

## 2023-09-04 DIAGNOSIS — R55 Syncope and collapse: Secondary | ICD-10-CM | POA: Diagnosis not present

## 2023-09-04 DIAGNOSIS — R41841 Cognitive communication deficit: Secondary | ICD-10-CM | POA: Diagnosis not present

## 2023-09-04 DIAGNOSIS — J449 Chronic obstructive pulmonary disease, unspecified: Secondary | ICD-10-CM | POA: Diagnosis not present

## 2023-09-04 DIAGNOSIS — Z743 Need for continuous supervision: Secondary | ICD-10-CM | POA: Diagnosis not present

## 2023-09-04 DIAGNOSIS — Z789 Other specified health status: Secondary | ICD-10-CM | POA: Diagnosis not present

## 2023-09-06 DIAGNOSIS — R7881 Bacteremia: Secondary | ICD-10-CM | POA: Diagnosis not present

## 2023-09-06 DIAGNOSIS — J449 Chronic obstructive pulmonary disease, unspecified: Secondary | ICD-10-CM | POA: Diagnosis not present

## 2023-09-06 DIAGNOSIS — R54 Age-related physical debility: Secondary | ICD-10-CM | POA: Diagnosis not present

## 2023-09-06 DIAGNOSIS — B9620 Unspecified Escherichia coli [E. coli] as the cause of diseases classified elsewhere: Secondary | ICD-10-CM | POA: Diagnosis not present

## 2023-09-06 DIAGNOSIS — R55 Syncope and collapse: Secondary | ICD-10-CM | POA: Diagnosis not present

## 2023-09-07 DIAGNOSIS — F419 Anxiety disorder, unspecified: Secondary | ICD-10-CM | POA: Diagnosis not present

## 2023-09-18 DIAGNOSIS — E871 Hypo-osmolality and hyponatremia: Secondary | ICD-10-CM | POA: Diagnosis not present

## 2023-09-18 DIAGNOSIS — J449 Chronic obstructive pulmonary disease, unspecified: Secondary | ICD-10-CM | POA: Diagnosis not present

## 2023-09-18 DIAGNOSIS — B9620 Unspecified Escherichia coli [E. coli] as the cause of diseases classified elsewhere: Secondary | ICD-10-CM | POA: Diagnosis not present

## 2023-09-18 DIAGNOSIS — N390 Urinary tract infection, site not specified: Secondary | ICD-10-CM | POA: Diagnosis not present

## 2023-09-18 DIAGNOSIS — Z8679 Personal history of other diseases of the circulatory system: Secondary | ICD-10-CM | POA: Diagnosis not present

## 2023-09-18 DIAGNOSIS — F03B4 Unspecified dementia, moderate, with anxiety: Secondary | ICD-10-CM | POA: Diagnosis not present

## 2023-09-19 DIAGNOSIS — R4189 Other symptoms and signs involving cognitive functions and awareness: Secondary | ICD-10-CM | POA: Diagnosis not present

## 2023-09-20 DIAGNOSIS — R2681 Unsteadiness on feet: Secondary | ICD-10-CM | POA: Diagnosis not present

## 2023-09-20 DIAGNOSIS — M6281 Muscle weakness (generalized): Secondary | ICD-10-CM | POA: Diagnosis not present

## 2023-09-21 DIAGNOSIS — R2681 Unsteadiness on feet: Secondary | ICD-10-CM | POA: Diagnosis not present

## 2023-09-21 DIAGNOSIS — M6281 Muscle weakness (generalized): Secondary | ICD-10-CM | POA: Diagnosis not present

## 2023-09-26 DIAGNOSIS — R2681 Unsteadiness on feet: Secondary | ICD-10-CM | POA: Diagnosis not present

## 2023-09-26 DIAGNOSIS — M6281 Muscle weakness (generalized): Secondary | ICD-10-CM | POA: Diagnosis not present

## 2023-09-27 DIAGNOSIS — R2681 Unsteadiness on feet: Secondary | ICD-10-CM | POA: Diagnosis not present

## 2023-09-27 DIAGNOSIS — M6281 Muscle weakness (generalized): Secondary | ICD-10-CM | POA: Diagnosis not present

## 2023-10-02 DIAGNOSIS — M6281 Muscle weakness (generalized): Secondary | ICD-10-CM | POA: Diagnosis not present

## 2023-10-02 DIAGNOSIS — R2681 Unsteadiness on feet: Secondary | ICD-10-CM | POA: Diagnosis not present

## 2023-10-03 DIAGNOSIS — M6281 Muscle weakness (generalized): Secondary | ICD-10-CM | POA: Diagnosis not present

## 2023-10-03 DIAGNOSIS — R2681 Unsteadiness on feet: Secondary | ICD-10-CM | POA: Diagnosis not present

## 2023-10-04 DIAGNOSIS — M6281 Muscle weakness (generalized): Secondary | ICD-10-CM | POA: Diagnosis not present

## 2023-10-04 DIAGNOSIS — R2681 Unsteadiness on feet: Secondary | ICD-10-CM | POA: Diagnosis not present

## 2023-10-05 DIAGNOSIS — R310 Gross hematuria: Secondary | ICD-10-CM | POA: Diagnosis not present

## 2023-10-05 DIAGNOSIS — N8110 Cystocele, unspecified: Secondary | ICD-10-CM | POA: Diagnosis not present

## 2023-10-09 DIAGNOSIS — L84 Corns and callosities: Secondary | ICD-10-CM | POA: Diagnosis not present

## 2023-10-09 DIAGNOSIS — L600 Ingrowing nail: Secondary | ICD-10-CM | POA: Diagnosis not present

## 2023-10-09 DIAGNOSIS — B351 Tinea unguium: Secondary | ICD-10-CM | POA: Diagnosis not present

## 2023-10-09 DIAGNOSIS — I739 Peripheral vascular disease, unspecified: Secondary | ICD-10-CM | POA: Diagnosis not present

## 2023-10-09 DIAGNOSIS — L603 Nail dystrophy: Secondary | ICD-10-CM | POA: Diagnosis not present

## 2023-10-11 DIAGNOSIS — B9689 Other specified bacterial agents as the cause of diseases classified elsewhere: Secondary | ICD-10-CM | POA: Diagnosis not present

## 2023-10-11 DIAGNOSIS — E039 Hypothyroidism, unspecified: Secondary | ICD-10-CM | POA: Diagnosis not present

## 2023-10-11 DIAGNOSIS — N390 Urinary tract infection, site not specified: Secondary | ICD-10-CM | POA: Diagnosis not present

## 2023-10-11 DIAGNOSIS — F02B4 Dementia in other diseases classified elsewhere, moderate, with anxiety: Secondary | ICD-10-CM | POA: Diagnosis not present

## 2023-10-12 DIAGNOSIS — M6281 Muscle weakness (generalized): Secondary | ICD-10-CM | POA: Diagnosis not present

## 2023-10-12 DIAGNOSIS — R2681 Unsteadiness on feet: Secondary | ICD-10-CM | POA: Diagnosis not present

## 2023-10-13 DIAGNOSIS — R54 Age-related physical debility: Secondary | ICD-10-CM | POA: Diagnosis not present

## 2023-10-13 DIAGNOSIS — J449 Chronic obstructive pulmonary disease, unspecified: Secondary | ICD-10-CM | POA: Diagnosis not present

## 2023-10-13 DIAGNOSIS — F03B4 Unspecified dementia, moderate, with anxiety: Secondary | ICD-10-CM | POA: Diagnosis not present

## 2023-10-16 DIAGNOSIS — M6281 Muscle weakness (generalized): Secondary | ICD-10-CM | POA: Diagnosis not present

## 2023-10-16 DIAGNOSIS — R2681 Unsteadiness on feet: Secondary | ICD-10-CM | POA: Diagnosis not present

## 2023-10-18 DIAGNOSIS — R2681 Unsteadiness on feet: Secondary | ICD-10-CM | POA: Diagnosis not present

## 2023-10-18 DIAGNOSIS — M6281 Muscle weakness (generalized): Secondary | ICD-10-CM | POA: Diagnosis not present

## 2023-10-18 DIAGNOSIS — R55 Syncope and collapse: Secondary | ICD-10-CM | POA: Diagnosis not present

## 2023-10-18 DIAGNOSIS — Z7189 Other specified counseling: Secondary | ICD-10-CM | POA: Diagnosis not present

## 2023-10-19 DIAGNOSIS — R2681 Unsteadiness on feet: Secondary | ICD-10-CM | POA: Diagnosis not present

## 2023-10-19 DIAGNOSIS — M6281 Muscle weakness (generalized): Secondary | ICD-10-CM | POA: Diagnosis not present

## 2023-10-23 DIAGNOSIS — R600 Localized edema: Secondary | ICD-10-CM | POA: Diagnosis not present

## 2023-10-24 DIAGNOSIS — M6281 Muscle weakness (generalized): Secondary | ICD-10-CM | POA: Diagnosis not present

## 2023-10-24 DIAGNOSIS — R2681 Unsteadiness on feet: Secondary | ICD-10-CM | POA: Diagnosis not present

## 2023-10-25 DIAGNOSIS — R600 Localized edema: Secondary | ICD-10-CM | POA: Diagnosis not present

## 2023-10-25 DIAGNOSIS — R2681 Unsteadiness on feet: Secondary | ICD-10-CM | POA: Diagnosis not present

## 2023-10-25 DIAGNOSIS — M6281 Muscle weakness (generalized): Secondary | ICD-10-CM | POA: Diagnosis not present

## 2023-10-26 DIAGNOSIS — M6281 Muscle weakness (generalized): Secondary | ICD-10-CM | POA: Diagnosis not present

## 2023-10-26 DIAGNOSIS — R2681 Unsteadiness on feet: Secondary | ICD-10-CM | POA: Diagnosis not present

## 2023-11-01 DIAGNOSIS — M6281 Muscle weakness (generalized): Secondary | ICD-10-CM | POA: Diagnosis not present

## 2023-11-01 DIAGNOSIS — R600 Localized edema: Secondary | ICD-10-CM | POA: Diagnosis not present

## 2023-11-01 DIAGNOSIS — R2681 Unsteadiness on feet: Secondary | ICD-10-CM | POA: Diagnosis not present

## 2023-11-02 DIAGNOSIS — M6281 Muscle weakness (generalized): Secondary | ICD-10-CM | POA: Diagnosis not present

## 2023-11-02 DIAGNOSIS — R2681 Unsteadiness on feet: Secondary | ICD-10-CM | POA: Diagnosis not present

## 2023-11-03 DIAGNOSIS — R2681 Unsteadiness on feet: Secondary | ICD-10-CM | POA: Diagnosis not present

## 2023-11-03 DIAGNOSIS — M6281 Muscle weakness (generalized): Secondary | ICD-10-CM | POA: Diagnosis not present

## 2023-11-07 DIAGNOSIS — M6281 Muscle weakness (generalized): Secondary | ICD-10-CM | POA: Diagnosis not present

## 2023-11-07 DIAGNOSIS — R2681 Unsteadiness on feet: Secondary | ICD-10-CM | POA: Diagnosis not present

## 2023-11-08 DIAGNOSIS — R2681 Unsteadiness on feet: Secondary | ICD-10-CM | POA: Diagnosis not present

## 2023-11-08 DIAGNOSIS — M6281 Muscle weakness (generalized): Secondary | ICD-10-CM | POA: Diagnosis not present

## 2023-11-09 DIAGNOSIS — M6281 Muscle weakness (generalized): Secondary | ICD-10-CM | POA: Diagnosis not present

## 2023-11-09 DIAGNOSIS — R2681 Unsteadiness on feet: Secondary | ICD-10-CM | POA: Diagnosis not present

## 2023-11-10 DIAGNOSIS — M549 Dorsalgia, unspecified: Secondary | ICD-10-CM | POA: Diagnosis not present

## 2023-11-20 DIAGNOSIS — F03B4 Unspecified dementia, moderate, with anxiety: Secondary | ICD-10-CM | POA: Diagnosis not present

## 2023-11-20 DIAGNOSIS — Z1331 Encounter for screening for depression: Secondary | ICD-10-CM | POA: Diagnosis not present

## 2023-11-20 DIAGNOSIS — J449 Chronic obstructive pulmonary disease, unspecified: Secondary | ICD-10-CM | POA: Diagnosis not present

## 2023-11-27 DIAGNOSIS — M549 Dorsalgia, unspecified: Secondary | ICD-10-CM | POA: Diagnosis not present

## 2023-11-30 DIAGNOSIS — Z23 Encounter for immunization: Secondary | ICD-10-CM | POA: Diagnosis not present

## 2023-12-21 DIAGNOSIS — J449 Chronic obstructive pulmonary disease, unspecified: Secondary | ICD-10-CM | POA: Diagnosis not present

## 2023-12-25 DIAGNOSIS — E871 Hypo-osmolality and hyponatremia: Secondary | ICD-10-CM | POA: Diagnosis not present

## 2024-01-08 DIAGNOSIS — I739 Peripheral vascular disease, unspecified: Secondary | ICD-10-CM | POA: Diagnosis not present

## 2024-01-08 DIAGNOSIS — L600 Ingrowing nail: Secondary | ICD-10-CM | POA: Diagnosis not present

## 2024-01-08 DIAGNOSIS — B351 Tinea unguium: Secondary | ICD-10-CM | POA: Diagnosis not present

## 2024-01-08 DIAGNOSIS — L603 Nail dystrophy: Secondary | ICD-10-CM | POA: Diagnosis not present

## 2024-01-08 DIAGNOSIS — L84 Corns and callosities: Secondary | ICD-10-CM | POA: Diagnosis not present
